# Patient Record
Sex: Female | Born: 1945 | Race: White | Hispanic: No | Marital: Married | Smoking: Never smoker
Health system: Southern US, Community
[De-identification: ages and names within clinical notes are randomized; demographics above are authoritative.]

## PROBLEM LIST (undated history)

## (undated) DIAGNOSIS — Z8249 Family history of ischemic heart disease and other diseases of the circulatory system: Secondary | ICD-10-CM

## (undated) DIAGNOSIS — Z780 Asymptomatic menopausal state: Secondary | ICD-10-CM

## (undated) DIAGNOSIS — E039 Hypothyroidism, unspecified: Secondary | ICD-10-CM

## (undated) DIAGNOSIS — Z7189 Other specified counseling: Secondary | ICD-10-CM

## (undated) HISTORY — DX: Asymptomatic menopausal state: Z78.0

## (undated) HISTORY — DX: Hypothyroidism, unspecified: E03.9

## (undated) HISTORY — DX: Family history of ischemic heart disease and other diseases of the circulatory system: Z82.49

## (undated) HISTORY — DX: Other specified counseling: Z71.89

---
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
## 2008-10-04 ENCOUNTER — Encounter: Admission: RE | Admit: 2008-10-04 | Discharge: 2008-10-04 | Payer: Self-pay | Admitting: Family Medicine

## 2009-06-13 ENCOUNTER — Ambulatory Visit: Payer: Self-pay | Admitting: Diagnostic Radiology

## 2009-06-13 ENCOUNTER — Emergency Department (HOSPITAL_BASED_OUTPATIENT_CLINIC_OR_DEPARTMENT_OTHER): Admission: EM | Admit: 2009-06-13 | Discharge: 2009-06-13 | Payer: Self-pay | Admitting: Emergency Medicine

## 2009-10-07 HISTORY — PX: REVISION OF SCAR TISSUE RECTUS MUSCLE: SHX2351

## 2011-01-11 LAB — URINE CULTURE
Colony Count: NO GROWTH
Culture: NO GROWTH

## 2011-01-11 LAB — URINALYSIS, ROUTINE W REFLEX MICROSCOPIC
Ketones, ur: 40 mg/dL — AB
Nitrite: NEGATIVE
Protein, ur: 100 mg/dL — AB
Specific Gravity, Urine: 1.025 (ref 1.005–1.030)
Urobilinogen, UA: 2 mg/dL — ABNORMAL HIGH (ref 0.0–1.0)

## 2011-01-21 DIAGNOSIS — Z8719 Personal history of other diseases of the digestive system: Secondary | ICD-10-CM | POA: Insufficient documentation

## 2014-05-04 DIAGNOSIS — H43399 Other vitreous opacities, unspecified eye: Secondary | ICD-10-CM | POA: Diagnosis not present

## 2014-07-22 DIAGNOSIS — N8320 Unspecified ovarian cysts: Secondary | ICD-10-CM | POA: Diagnosis not present

## 2014-07-22 DIAGNOSIS — Z01419 Encounter for gynecological examination (general) (routine) without abnormal findings: Secondary | ICD-10-CM | POA: Diagnosis not present

## 2014-07-22 DIAGNOSIS — N952 Postmenopausal atrophic vaginitis: Secondary | ICD-10-CM | POA: Diagnosis not present

## 2014-10-26 DIAGNOSIS — Z0000 Encounter for general adult medical examination without abnormal findings: Secondary | ICD-10-CM | POA: Diagnosis not present

## 2014-10-26 DIAGNOSIS — E039 Hypothyroidism, unspecified: Secondary | ICD-10-CM | POA: Diagnosis not present

## 2014-10-26 DIAGNOSIS — E785 Hyperlipidemia, unspecified: Secondary | ICD-10-CM | POA: Diagnosis not present

## 2014-10-26 LAB — LIPID PANEL
CHOLESTEROL: 227 mg/dL — AB (ref 0–200)
HDL: 57 mg/dL (ref 35–70)
LDL Cholesterol: 158 mg/dL
TRIGLYCERIDES: 101 mg/dL (ref 40–160)

## 2014-10-26 LAB — BASIC METABOLIC PANEL
BUN: 17 mg/dL (ref 4–21)
Creatinine: 0.8 mg/dL (ref <=1.1)

## 2014-10-26 LAB — TSH: TSH: 1.61 u[IU]/mL (ref <=5.90)

## 2014-10-26 LAB — HEPATIC FUNCTION PANEL
ALT: 18 U/L (ref 7–35)
AST: 26 U/L (ref 13–35)

## 2014-11-14 DIAGNOSIS — Z1231 Encounter for screening mammogram for malignant neoplasm of breast: Secondary | ICD-10-CM | POA: Diagnosis not present

## 2014-11-14 DIAGNOSIS — Z780 Asymptomatic menopausal state: Secondary | ICD-10-CM | POA: Diagnosis not present

## 2014-12-04 DIAGNOSIS — H1033 Unspecified acute conjunctivitis, bilateral: Secondary | ICD-10-CM | POA: Diagnosis not present

## 2015-07-10 DIAGNOSIS — N83202 Unspecified ovarian cyst, left side: Secondary | ICD-10-CM | POA: Diagnosis not present

## 2015-07-12 DIAGNOSIS — N83202 Unspecified ovarian cyst, left side: Secondary | ICD-10-CM | POA: Diagnosis not present

## 2015-07-12 DIAGNOSIS — Z780 Asymptomatic menopausal state: Secondary | ICD-10-CM | POA: Diagnosis not present

## 2015-07-12 DIAGNOSIS — E785 Hyperlipidemia, unspecified: Secondary | ICD-10-CM | POA: Diagnosis not present

## 2015-07-12 DIAGNOSIS — Z0000 Encounter for general adult medical examination without abnormal findings: Secondary | ICD-10-CM | POA: Diagnosis not present

## 2015-07-12 DIAGNOSIS — N83209 Unspecified ovarian cyst, unspecified side: Secondary | ICD-10-CM | POA: Diagnosis not present

## 2015-08-16 ENCOUNTER — Ambulatory Visit (INDEPENDENT_AMBULATORY_CARE_PROVIDER_SITE_OTHER): Payer: Medicare Other | Admitting: Osteopathic Medicine

## 2015-08-16 ENCOUNTER — Encounter: Payer: Self-pay | Admitting: Osteopathic Medicine

## 2015-08-16 ENCOUNTER — Ambulatory Visit (INDEPENDENT_AMBULATORY_CARE_PROVIDER_SITE_OTHER): Payer: Medicare Other

## 2015-08-16 VITALS — BP 123/78 | HR 64 | Ht 62.0 in | Wt 158.0 lb

## 2015-08-16 DIAGNOSIS — E039 Hypothyroidism, unspecified: Secondary | ICD-10-CM

## 2015-08-16 DIAGNOSIS — M79601 Pain in right arm: Secondary | ICD-10-CM | POA: Diagnosis not present

## 2015-08-16 DIAGNOSIS — M5136 Other intervertebral disc degeneration, lumbar region: Secondary | ICD-10-CM | POA: Diagnosis not present

## 2015-08-16 DIAGNOSIS — M545 Low back pain, unspecified: Secondary | ICD-10-CM

## 2015-08-16 DIAGNOSIS — G8929 Other chronic pain: Secondary | ICD-10-CM

## 2015-08-16 DIAGNOSIS — M79604 Pain in right leg: Secondary | ICD-10-CM

## 2015-08-16 DIAGNOSIS — M4806 Spinal stenosis, lumbar region: Secondary | ICD-10-CM | POA: Diagnosis not present

## 2015-08-16 DIAGNOSIS — Z9049 Acquired absence of other specified parts of digestive tract: Secondary | ICD-10-CM

## 2015-08-16 DIAGNOSIS — Z8249 Family history of ischemic heart disease and other diseases of the circulatory system: Secondary | ICD-10-CM

## 2015-08-16 DIAGNOSIS — Z780 Asymptomatic menopausal state: Secondary | ICD-10-CM

## 2015-08-16 DIAGNOSIS — M79606 Pain in leg, unspecified: Secondary | ICD-10-CM | POA: Insufficient documentation

## 2015-08-16 HISTORY — DX: Hypothyroidism, unspecified: E03.9

## 2015-08-16 HISTORY — DX: Family history of ischemic heart disease and other diseases of the circulatory system: Z82.49

## 2015-08-16 HISTORY — DX: Asymptomatic menopausal state: Z78.0

## 2015-08-16 LAB — TSH: TSH: 4.869 u[IU]/mL — ABNORMAL HIGH (ref 0.350–4.500)

## 2015-08-16 MED ORDER — DICLOFENAC SODIUM 1 % TD GEL
2.0000 g | Freq: Four times a day (QID) | TRANSDERMAL | Status: DC | PRN
Start: 2015-08-16 — End: 2015-08-16

## 2015-08-16 MED ORDER — MELOXICAM 7.5 MG PO TABS: 7.5000 mg | ORAL_TABLET | Freq: Every day | ORAL | Status: DC

## 2015-08-16 MED ORDER — DICLOFENAC SODIUM 1 % TD GEL: 2.0000 g | Freq: Four times a day (QID) | TRANSDERMAL | Status: DC | PRN

## 2015-08-16 NOTE — Progress Notes (Signed)
HPI: Hailey Parrish is a 69 y.o. female who presents to [facility]  today for chief complaint of:  Chief Complaint  Patient presents with  . Establish Care    back and leg pain   Back pain . Location: lower back, bilateral, in middle and across both side . Quality: soreness . Severity: moderate . Duration: 3 months, worse past few days . Context: no injury but (+)hx sciatica.  . Modifying factors: taking OTC pain meds which dull it but doesn't stop the pain, no XRay in the past, no old injury, hurts more if lying down  . Assoc signs/symptoms: No sciatica at this time, some aching in lower abdomen  R Leg . Location: R leg on lateral side around knee but not  . Quality: pulling pain, wakes her up . Severity: moderate . Duration: several months . Timing: intermittent . Context: no hx injury . Modifying factors: OTC meds helping some  Other:  goes to GYN for ovarian cyst last bloodwork showed borderline levels cholesterol Thyroid: last checked about 8 months ago   Past medical, social and family history reviewed: Past Medical History  Diagnosis Date  . Hypothyroidism 08/16/2015  . Family history of MI (myocardial infarction) 08/16/2015    father   No past surgical history on file. Social History  Substance Use Topics  . Smoking status: Not on file  . Smokeless tobacco: Not on file  . Alcohol Use: Not on file   Family History  Problem Relation Age of Onset  . Hyperlipidemia Mother   . Cancer Father     lung  . Heart attack Father     Current Outpatient Prescriptions  Medication Sig Dispense Refill  . levothyroxine (SYNTHROID, LEVOTHROID) 100 MCG tablet Take 100 mcg by mouth daily before breakfast.    . valACYclovir (VALTREX) 500 MG tablet Take 500 mg by mouth 2 (two) times daily.     No current facility-administered medications for this visit.   No Known Allergies    Review of Systems: CONSTITUTIONAL:  No  fever, no chills, No   unintentional weight changes HEAD/EYES/EARS/NOSE/THROAT: No headache, no vision change, no hearing change, No  sore throat CARDIAC: No chest pain, no pressure/palpitations, no orthopnea RESPIRATORY: No  cough, No  shortness of breath/wheeze GASTROINTESTINAL: No nausea, no vomiting, no abdominal pain, no blood in stool, no diarrhea, no constipation MUSCULOSKELETAL: Yes  Myalgia/arthralgia - see HPI GENITOURINARY: No incontinence, No abnormal genital bleeding/discharge SKIN: No rash/wounds/concerning lesions HEM/ONC: No easy bruising/bleeding, no abnormal lymph node ENDOCRINE: No polyuria/polydipsia/polyphagia, no heat/cold intolerance  NEUROLOGIC: No weakness, no dizziness, no slurred speech PSYCHIATRIC: No concerns with depression, no concerns with anxiety, no sleep problems    Exam:  BP 123/78 mmHg  Pulse 64  Ht 5\' 2"  (1.575 m)  Wt 158 lb (71.668 kg)  BMI 28.89 kg/m2 Constitutional: VSS, see above. General Appearance: alert, well-developed, well-nourished, NAD Respiratory: Normal respiratory effort. no wheeze, no rhonchi, no rales Cardiovascular: S1/S2 normal, no murmur, no rub/gallop auscultated. RRR. No lower extremity edema. Musculoskeletal: Gait normal. No clubbing/cyanosis of digits. Back: normal ROM flex/ext and rotation and sidebend, mild paraspinal tenderness lumbar spine bilateral, no midline tenderness. R leg/knee: ant drawer neg, meniscal signs neg, fibular head nontender possible posterior fibular head Neurological: No cranial nerve deficit on limited exam. Motor and sensation intact and symmetric Psychiatric: Normal judgment/insight. Normal mood and affect. Oriented x3.    No results found for this or any previous visit (from the past 72  hour(s)).    ASSESSMENT/PLAN:  Chronic low back pain - Plan: meloxicam (MOBIC) 7.5 MG tablet, Ambulatory referral to Physical Therapy, DG Lumbar Spine Complete, diclofenac sodium (VOLTAREN) 1 % GEL - XR expected to show arthritis, if  pain continues despite PT will consider MRI in the future but reassuring that no sciatica or weakness, also advised worthwhile to get orthotics, she has had these before but long time ago, wants to start walking more.   Leg pain, lateral, right - Plan: meloxicam (MOBIC) 7.5 MG tablet, Ambulatory referral to Physical Therapy, diclofenac sodium (VOLTAREN) 1 % GEL. OMT muscle energy to fibular head without relief of symptoms. Don't think XR would be helpful at this point but advised Voltaren gel and PT, consider imaging if still painful, no claudication  Hypothyroidism, unspecified hypothyroidism type - Plan: TSH   Return in about 6 months (around 02/13/2016), or if symptoms worsen or fail to improve, for ANNUAL PHYSICAL.

## 2015-08-16 NOTE — Patient Instructions (Signed)
DOWNSTAIRS FOR X-RAY AND LAB DRAW YOU SHOULD HEAR FROM US ABOUT LAB AND XRAY RESULTS IN 1 - 2 DAYS  YO USHOULD HEAR ABOUT PHYSICAL THERAPY APPOINTMENT,  OR YOU CAN WALK DOWN TO THEIR OFFICE IN THIS BUILDING AND SCHEDULE A VISIT.   MAKE AN APPOINTMENT WITH DR. Denyse AmassOREY OR DR. T. AT YOUR CONVENIENCE FOR ORTHOTICS.

## 2015-08-17 ENCOUNTER — Ambulatory Visit (INDEPENDENT_AMBULATORY_CARE_PROVIDER_SITE_OTHER): Payer: Medicare Other | Admitting: Family Medicine

## 2015-08-17 ENCOUNTER — Telehealth: Payer: Self-pay

## 2015-08-17 ENCOUNTER — Encounter: Payer: Self-pay | Admitting: Osteopathic Medicine

## 2015-08-17 ENCOUNTER — Other Ambulatory Visit: Payer: Self-pay

## 2015-08-17 VITALS — BP 130/85 | HR 57 | Temp 98.0°F | Resp 18 | Wt 159.0 lb

## 2015-08-17 DIAGNOSIS — M79671 Pain in right foot: Secondary | ICD-10-CM | POA: Insufficient documentation

## 2015-08-17 DIAGNOSIS — M545 Low back pain, unspecified: Secondary | ICD-10-CM

## 2015-08-17 DIAGNOSIS — M79604 Pain in right leg: Secondary | ICD-10-CM

## 2015-08-17 DIAGNOSIS — Z7189 Other specified counseling: Secondary | ICD-10-CM

## 2015-08-17 DIAGNOSIS — G8929 Other chronic pain: Secondary | ICD-10-CM

## 2015-08-17 HISTORY — DX: Other specified counseling: Z71.89

## 2015-08-17 MED ORDER — LEVOTHYROXINE SODIUM 112 MCG PO TABS: 112.0000 ug | ORAL_TABLET | Freq: Every day | ORAL | Status: DC

## 2015-08-17 MED ORDER — MELOXICAM 7.5 MG PO TABS: 7.5000 mg | ORAL_TABLET | Freq: Every day | ORAL | Status: DC

## 2015-08-17 NOTE — Progress Notes (Signed)
Hailey Parrish is a 69 y.o. female who [facility]  today for Foot pain. Patient was seen by her primary care provider yesterday. She notes continued chronic bilateral foot pain. Currently here right foot hurts the worst. She notes pain at the first metatarsal tarsal joints especially with walking. She's used orthotics in the past which helped some. She denies any recent injury fevers chills nausea vomiting or diarrhea.   Past Medical History  Diagnosis Date  . Hypothyroidism 08/16/2015  . Family history of MI (myocardial infarction) 08/16/2015    father  . Postmenopausal 08/16/2015  . Cardiac risk counseling 08/17/2015    ASCVD 10 year risk 6.7% calculated 08/2015   Past Surgical History  Procedure Laterality Date  . Appendectomy  1963  . Revision of scar tissue rectus muscle  2011   Social History  Substance Use Topics  . Smoking status: Not on file  . Smokeless tobacco: Not on file  . Alcohol Use: Not on file   family history includes Cancer in her father; Heart attack in her father; Hyperlipidemia in her mother.  ROS as above Medications: Current Outpatient Prescriptions  Medication Sig Dispense Refill  . diclofenac sodium (VOLTAREN) 1 % GEL Apply 2 g topically 4 (four) times daily as needed (muscle pain). 100 g 3  . levothyroxine (SYNTHROID, LEVOTHROID) 100 MCG tablet Take 100 mcg by mouth daily before breakfast.    . valACYclovir (VALTREX) 500 MG tablet Take 500 mg by mouth 2 (two) times daily.    . meloxicam (MOBIC) 7.5 MG tablet Take 1 tablet (7.5 mg total) by mouth daily. 30 tablet 3   No current facility-administered medications for this visit.   No Known Allergies   Exam:  BP 130/85 mmHg  Pulse 57  Temp(Src) 98 F (36.7 C) (Oral)  Resp 18  Wt 159 lb (72.122 kg)  SpO2 100% Gen: Well NAD MSK: Feet bilaterally have bunions and bunionette bilaterally with some collapse of both the transverse and longitudinal  arches.Her ankles do not have significant pronation or supination. Her right first ray metatarsal tarsal joint is tender to palpation. Pulses capillary refill and sensation are intact  Orthotics Note:   Patient was fitted for a : standard, cushioned, semi-rigid orthotic. The orthotic was heated and afterward the patient stood on the orthotic blank positioned on the orthotic stand. The patient was positioned in subtalar neutral position and 10 degrees of ankle dorsiflexion in a weight bearing stance. After completion of molding, a stable base was applied to the orthotic blank. The blank was ground to a stable position for weight bearing. Size: 9 Base: [facility] Additional Posting and Padding: None The patient ambulated these, and they were very comfortable.  I spent 40 minutes with this patient, greater than 50% was face-to-face time counseling regarding the below diagnosis.    Please see individual assessment and plan sections.

## 2015-08-17 NOTE — Addendum Note (Signed)
Addended by: Deirdre PippinsALEXANDER, Wm Fruchter M on: 08/17/2015 11:51 AM   Modules accepted: Orders

## 2015-08-17 NOTE — Telephone Encounter (Signed)
PA for diclofenac gel sent through covermymeds.  

## 2015-08-17 NOTE — Patient Instructions (Signed)
Thank you for coming in today. Lets try the orthotics.  Return if not improving.

## 2015-08-17 NOTE — Assessment & Plan Note (Signed)
Predominant right foot pain seems to be at the metatarsal tarsal joint. 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 Orthotics today. Return if not improving. At that time would obtain x-rays and consider ultrasound guided injection of the metatarsal tarsal joint.

## 2015-08-17 NOTE — Telephone Encounter (Signed)
Patient [facility]. Rhonda Cunningham,CMA

## 2015-08-21 ENCOUNTER — Encounter: Payer: Self-pay | Admitting: Physical Therapy

## 2015-08-21 ENCOUNTER — Telehealth: Payer: Self-pay

## 2015-08-21 ENCOUNTER — Ambulatory Visit (INDEPENDENT_AMBULATORY_CARE_PROVIDER_SITE_OTHER): Payer: Medicare Other | Admitting: Physical Therapy

## 2015-08-21 DIAGNOSIS — G8929 Other chronic pain: Secondary | ICD-10-CM

## 2015-08-21 DIAGNOSIS — R29898 Other symptoms and signs involving the musculoskeletal system: Secondary | ICD-10-CM

## 2015-08-21 DIAGNOSIS — E039 Hypothyroidism, unspecified: Secondary | ICD-10-CM

## 2015-08-21 DIAGNOSIS — M545 Low back pain, unspecified: Secondary | ICD-10-CM

## 2015-08-21 DIAGNOSIS — M79604 Pain in right leg: Secondary | ICD-10-CM

## 2015-08-21 DIAGNOSIS — M25561 Pain in right knee: Secondary | ICD-10-CM

## 2015-08-21 DIAGNOSIS — M6281 Muscle weakness (generalized): Secondary | ICD-10-CM | POA: Diagnosis present

## 2015-08-21 DIAGNOSIS — R198 Other specified symptoms and signs involving the digestive system and abdomen: Secondary | ICD-10-CM | POA: Diagnosis not present

## 2015-08-21 MED ORDER — DICLOFENAC SODIUM 1 % TD GEL: 2.0000 g | Freq: Four times a day (QID) | TRANSDERMAL | Status: DC | PRN

## 2015-08-21 MED ORDER — LEVOTHYROXINE SODIUM 112 MCG PO TABS: 112.0000 ug | ORAL_TABLET | Freq: Every day | ORAL | Status: DC

## 2015-08-21 NOTE — Telephone Encounter (Signed)
REFILL REQUEST SENT TO [facility]. Hailey Parrish,CMA

## 2015-08-21 NOTE — Therapy (Signed)
[facility] [place] [address] [place], [place], [address] Phone: [phone]   Fax:  [phone]  Physical Therapy Evaluation  Patient Details  Name: Hailey Parrish MRN: [id number] Date of Birth: [birth year] Referring Provider: Dr Sunnie Nielsen  Encounter Date: 08/21/2015      PT End of Session - 08/21/15 1020    Visit Number 1   Number of Visits 8   Date for PT Re-Evaluation 09/18/15   PT Start Time 0935   PT Stop Time 1036   PT Time Calculation (min) 61 min   Activity Tolerance Patient tolerated treatment well  Good response from TDN in her Rt lower leg      Past Medical History  Diagnosis Date  . Hypothyroidism 08/16/2015  . Family history of MI (myocardial infarction) 08/16/2015    father  . Postmenopausal 08/16/2015  . Cardiac risk counseling 08/17/2015    ASCVD 10 year risk 6.7% calculated 08/2015    Past Surgical History  Procedure Laterality Date  . Appendectomy  1963  . Revision of scar tissue rectus muscle  2011    There were no vitals filed for this visit.  Visit Diagnosis:  Weakness of back  Abdominal weakness  Arthralgia of right lower leg  Bilateral low back pain without sciatica      Subjective Assessment - 08/21/15 0933    Subjective Patient reports she developed LBP over the summer and was affecting her walking and moving. Then is went away and returned about 2 weeks ago. Leg pain is most likely not related ot her back pain.  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 Two weeks ago her Rt knee would catch and  a feeling like something was sliding in her knee.    Pertinent History Leg pain is more bothersome because it is constant. Had orthotics revised last week.    How long can you sit comfortably? no limitted   How long can you walk comfortably? 30 min   Diagnostic tests x-ray arthritis   Patient Stated Goals patient wishes to have leg pain relief or learn something she  can do to help it out. Currently able to perform all her activity however has pain with these.   Currently in Pain? Yes  no back pain at this time, it is intermittent   Pain Score 5   8/10 when she has sharp pain - like a spam   Pain Location --  Rt shin   Pain Orientation Right   Pain Descriptors / Indicators Aching;Sharp;Spasm   Pain Type Acute pain   Pain Frequency Constant   Aggravating Factors  a lot of walking, standing a lot   Pain Relieving Factors sitting and elevation sometimes.             [facility] PT Assessment - 08/21/15 0001    Assessment   Medical Diagnosis Low back pain and Rt lateral leg pain   Referring Provider Dr Sunnie Nielsen   Onset Date/Surgical Date 08/07/15   Hand Dominance Right   Next MD Visit 01/2016   Prior Therapy none   Precautions   Precautions None   Balance Screen   Has the patient fallen in the past 6 months No   Has the patient had a decrease in activity level because of a fear of falling?  No   Is the patient reluctant to leave their home because of a fear of falling?  No   Home Environment  Living Environment Private residence   Prior Function   Level of Independence Independent   Vocation Retired   Higher education careers adviserLeisure kayak, read, walking   Observation/Other Assessments   Focus on Therapeutic Outcomes (FOTO)  33% limited   Functional Tests   Functional tests Squat;Single leg stance   Squat   Comments bilat hip adduction   Single Leg Stance   Comments > 12 sec bilat   Posture/Postural Control   Posture/Postural Control Postural limitations   Postural Limitations --  Rt shoulder complex elevated   ROM / Strength   AROM / PROM / Strength AROM;Strength   AROM   Overall AROM Comments Lumbar, bilat LE's WNL   Strength   Overall Strength Comments LE's WNL   Strength Assessment Site Lumbar   Lumbar Flexion --  TA poor    Lumbar Extension --  multifdis fair   Flexibility   Soft Tissue Assessment /Muscle Length --  Good LE flexibility    Palpation   Palpation comment trigger points & tenderness in Rt ant tib, post ib and gastroc, very tight in Rt lumbar paraspinals & QL, slight on Lt side.    Special Tests    Special Tests Lumbar   Lumbar Tests Slump Test;Straight Leg Raise   Slump test   Findings Negative   Straight Leg Raise   Findings Negative                   [facility] Adult PT Treatment/Exercise - 08/21/15 0001    Exercises   Exercises Lumbar   Lumbar Exercises: Stretches   Single Knee to Chest Stretch 30 seconds  biilat   Lumbar Exercises: Supine   Ab Set 10 reps   Clam 5 reps   Heel Slides 5 reps   Bent Knee Raise 5 reps   Modalities   Modalities Electrical Stimulation;Moist Heat   Moist Heat Therapy   Number Minutes Moist Heat 15 Minutes   Moist Heat Location Lumbar Spine  Rt shin   Electrical Stimulation   Electrical Stimulation Location lumbar   Electrical Stimulation Action IFC   Electrical Stimulation Parameters to tolerance   Electrical Stimulation Goals Tone;Pain          Trigger Point Dry Needling - 08/21/15 1039    Consent Given? Yes   Education Handout Provided No   Muscles Treated Upper Body Tibialis anterior  tib posterior              PT Education - 08/21/15 1010    Education provided Yes   Education Details HEP and TDN   Person(s) Educated Patient   Methods Explanation;Demonstration;Handout   Comprehension Returned demonstration;Verbalized understanding             PT Long Term Goals - 08/21/15 1309    PT LONG TERM GOAL #1   Title I with HEP (09/18/15)    Time 4   Period Weeks   Status New   PT LONG TERM GOAL #2   Title report pain decrease in Rt lower leg =/> 75% with daily activity (09/18/15)    Time 4   Period Weeks   Status New   PT LONG TERM GOAL #3   Title report back pain decrease =/> 75% with walking (09/18/15)    Time 4   Period Weeks   Status New   PT LONG TERM GOAL #4   Title perform core exercise with good contraction of TA  and multifidis (09/18/15)    Time 4   Period  Weeks   Status New   PT LONG TERM GOAL #5   Title improve FOTO =/< 29% limited CJ level (09/18/15)    Time 4   Period Weeks   Status New               Plan - 08/21/15 1306    Clinical Impression Statement 69 yo female presents with intermittent LBP and constant Rt lower leg pain.  She has a h/o Rt foot issues for many years, never had PT for this. She has some deep core weakness, trigger points in the Rt lower leg musculature  along with tightness in her back.    Pt will benefit from skilled therapeutic intervention in order to improve on the following deficits Increased muscle spasms;Pain;Difficulty walking;Decreased strength   Rehab Potential Excellent   PT Frequency 2x / week   PT Duration 4 weeks   PT Treatment/Interventions Vasopneumatic Device;Manual techniques;Therapeutic exercise;Moist Heat;Iontophoresis /ml Dexamethasone;Electrical Stimulation;Cryotherapy;Dry needling;Passive range of motion;Patient/family education;Gait training;Balance training;Ultrasound   PT Next Visit Plan progress core exercise, add in some ankle ther ex   Consulted and Agree with Plan of Care Patient         Problem List Patient Active Problem List   Diagnosis Date Noted  . Cardiac risk counseling 08/17/2015  . Foot pain, right 08/17/2015  . Hypothyroidism 08/16/2015  . History of appendectomy 08/16/2015  . Family history of MI (myocardial infarction) 08/16/2015  . Postmenopausal 08/16/2015  . Chronic low back pain 08/16/2015  . Leg pain, lateral 08/16/2015    Roderic Scarce PT 08/21/2015, 1:15 PM  [facility] [address] [place], [place], [address] Phone: [phone]   Fax:  [phone]  Name: Hailey Parrish MRN: [id number] Date of Birth: 1945-12-24

## 2015-08-21 NOTE — Patient Instructions (Signed)
Abdominal Bracing With Pelvic Floor (Hook-Lying)   With neutral spine, tighten pelvic floor and abdominals. Hold 5 seconds. Repeat __10_ times. Do _1__ times a day.   Knee to Chest: Transverse Plane Stability   Bring one knee up, then return. Be sure pelvis does not roll side to side. Keep pelvis still. Lift knee __10_ times each leg. Restabilize pelvis. Repeat with other leg. Do _1-2__ sets, _1__ times per day.  Hip External Rotation With Pillow: Transverse Plane Stability   One knee bent, one leg straight, on pillow. Slowly roll bent knee out. Be sure pelvis does not rotate. Do _10__ times. Restabilize pelvis. Repeat with other leg. Do _1-2__ sets, _1__ times per day.  Heel Slide: 4-10 Inches - Transverse Plane Stability   Slide heel 4 inches down. Be sure pelvis does not rotate. Do _10__ times. Restabilize pelvis. Repeat with other leg. Do __1_ sets, _1__ times per day.  Supine Knee to Chest    Lie on back. Gently pull one knee toward chest. Hold _30__ seconds.  Repeat _1__ times per session. Do _1__ sessions per day.  Lower Trunk Rotation Stretch    Keeping back flat and feet together, rotate knees to left side. Hold _1-2___ seconds. Then rotate knees to the other side. Repeat __10__ times per set. Do ___1_ sets per session. Do __1__ sessions per day.    [facility] [address] [place], [place] [address]  [phone] (office) [phone] (fax)  Trigger Point Dry Needling  . What is Trigger Point Dry Needling (DN)? o DN is a physical therapy technique used to treat muscle pain and dysfunction. Specifically, DN helps deactivate muscle trigger points (muscle knots).  o A thin filiform needle is used to penetrate the skin and stimulate the underlying trigger point. The goal is for a local twitch response (LTR) to occur and for the trigger point to relax. No medication of any kind is injected during the  procedure.   . What Does Trigger Point Dry Needling Feel Like?  o The procedure feels different for each individual patient. Some patients report that they do not actually feel the needle enter the skin and overall the process is not painful. Very mild bleeding may occur. However, many patients feel a deep cramping in the muscle in which the needle was inserted. This is the local twitch response.   Marland Kitchen. How Will I feel after the treatment? o Soreness is normal, and the onset of soreness may not occur for a few hours. Typically this soreness does not last longer than two days.  o Bruising is uncommon, however; ice can be used to decrease any possible bruising.  o In rare cases feeling tired or nauseous after the treatment is normal. In addition, your symptoms may get worse before they get better, this period will typically not last longer than 24 hours.   . What Can I do After My Treatment? o Increase your hydration by drinking more water for the next 24 hours. o You may place ice or heat on the areas treated that have become sore, however, do not use heat on inflamed or bruised areas. Heat often brings more relief post needling. o You can continue your regular activities, but vigorous activity is not recommended initially after the treatment for 24 hours. o DN is best combined with other physical therapy such as strengthening, stretching, and other therapies.  o

## 2015-08-22 ENCOUNTER — Ambulatory Visit: Payer: Medicare Other

## 2015-08-28 ENCOUNTER — Ambulatory Visit (INDEPENDENT_AMBULATORY_CARE_PROVIDER_SITE_OTHER): Payer: Medicare Other | Admitting: Physical Therapy

## 2015-08-28 DIAGNOSIS — M545 Low back pain, unspecified: Secondary | ICD-10-CM

## 2015-08-28 DIAGNOSIS — M6281 Muscle weakness (generalized): Secondary | ICD-10-CM | POA: Diagnosis not present

## 2015-08-28 DIAGNOSIS — R198 Other specified symptoms and signs involving the digestive system and abdomen: Secondary | ICD-10-CM | POA: Diagnosis present

## 2015-08-28 DIAGNOSIS — M25561 Pain in right knee: Secondary | ICD-10-CM

## 2015-08-28 DIAGNOSIS — R29898 Other symptoms and signs involving the musculoskeletal system: Secondary | ICD-10-CM

## 2015-08-28 NOTE — Therapy (Signed)
[facility] [address] [place], [place], [address] Phone: [phone]   Fax:  [phone]  Physical Therapy Treatment  Patient Details  Name: Hailey Fitchancy Gillentine MRN: [id number] Date of Birth: [birth year] Referring Provider: Dr. Lyn HollingsheadAlexander  Encounter Date: 08/28/2015      PT End of Session - 08/28/15 1021    Visit Number 2   Number of Visits 8   Date for PT Re-Evaluation 09/18/15   PT Start Time 1019   PT Stop Time 1115   PT Time Calculation (min) 56 min      Past Medical History  Diagnosis Date  . Hypothyroidism 08/16/2015  . Family history of MI (myocardial infarction) 08/16/2015    father  . Postmenopausal 08/16/2015  . Cardiac risk counseling 08/17/2015    ASCVD 10 year risk 6.7% calculated 08/2015    Past Surgical History  Procedure Laterality Date  . Appendectomy  1963  . Revision of scar tissue rectus muscle  2011    There were no vitals filed for this visit.  Visit Diagnosis:  Abdominal weakness  Arthralgia of right lower leg  Weakness of back  Bilateral low back pain without sciatica      Subjective Assessment - 08/28/15 1021    Subjective Pt reports that the Rt leg pain comes and goes.  [place]  Shopping (several hours) with daughter was difficult.     Currently in Pain? Yes   Pain Score 2    Pain Location Leg  lateral shin   Pain Orientation Right   Pain Descriptors / Indicators --  "twinge"    Aggravating Factors  a lot of walking, standing, pushing gas pedal   Pain Relieving Factors sitting, position, heat            [facility] PT Assessment - 08/28/15 0001    Assessment   Medical Diagnosis Low back pain and Rt lateral leg pain   Referring Provider Dr. Lyn HollingsheadAlexander   Onset Date/Surgical Date 08/07/15   Hand Dominance Right   Next MD Visit PRN   Prior Therapy none         [facility] Adult PT Treatment/Exercise - 08/28/15 0001    Exercises   Exercises Knee/Hip;Ankle;Lumbar   Lumbar Exercises: Stretches   Passive Hamstring Stretch 2 reps;30 seconds   Single Knee to Chest Stretch 30 seconds  biilat   Lower Trunk Rotation 3 reps;10 seconds   Lumbar Exercises: Aerobic   Stationary Bike L1: 6 min   Lumbar Exercises: Supine   Ab Set 5 reps;5 seconds   Clam 5 reps  with ab set, each leg   Heel Slides 5 reps  with ab set, each leg   Bent Knee Raise 10 reps  with ab set, each leg   Knee/Hip Exercises: Stretches   Gastroc Stretch Right;Left;2 reps;30 seconds   Soleus Stretch Right;Left;2 reps;30 seconds   Modalities   Modalities Electrical Stimulation;Moist Heat   Moist Heat Therapy   Number Minutes Moist Heat 15 Minutes   Moist Heat Location Lumbar Spine  Rt shin   Electrical Stimulation   Electrical Stimulation Location Rt ant/post tibialis    Electrical Stimulation Action IFC   Electrical Stimulation Parameters to tolerance   Electrical Stimulation Goals Pain;Tone   Manual Therapy   Manual Therapy Soft tissue mobilization;Myofascial release   Soft tissue mobilization to anterior and posterior tib of RLE.  Edge tool assistance to anterior tib/ fibularis longus - to decrease pain and  fascial restrctions.    Myofascial Release to Rt fibularis muscle group.    Ankle Exercises: Supine   T-Band Eversion, inversion, DF with Rt ankle, red band x 10 reps each. Required some tactile cues for form.                      PT Long Term Goals - 08/21/15 1309    PT LONG TERM GOAL #1   Title I with HEP (09/18/15)    Time 4   Period Weeks   Status New   PT LONG TERM GOAL #2   Title report pain decrease in Rt lower leg =/> 75% with daily activity (09/18/15)    Time 4   Period Weeks   Status New   PT LONG TERM GOAL #3   Title report back pain decrease =/> 75% with walking (09/18/15)    Time 4   Period Weeks   Status New   PT LONG TERM GOAL #4   Title perform core exercise with good contraction of TA and  multifidis (09/18/15)    Time 4   Period Weeks   Status New   PT LONG TERM GOAL #5   Title improve FOTO =/< 29% limited CJ level (09/18/15)    Time 4   Period Weeks   Status New               Plan - 08/28/15 1336    Clinical Impression Statement Pt tolerated all exercises well, requiring some tactile/ VC for form.  Pt tolerated all exercises except LTR - which caused increased twinge pain in Rt shin when knees to Lt.  Pt reported decrease in symptoms with use of estim/heat at end of session.  Progressing towards goals.    Pt will benefit from skilled therapeutic intervention in order to improve on the following deficits Increased muscle spasms;Pain;Difficulty walking;Decreased strength   Rehab Potential Excellent   PT Frequency 2x / week   PT Duration 4 weeks   PT Treatment/Interventions Vasopneumatic Device;Manual techniques;Therapeutic exercise;Moist Heat;Iontophoresis /ml Dexamethasone;Electrical Stimulation;Cryotherapy;Dry needling;Passive range of motion;Patient/family education;Gait training;Balance training;Ultrasound   PT Next Visit Plan Progress core exercises, repeat ankle exercises then add to HEP.    Consulted and Agree with Plan of Care Patient        Problem List Patient Active Problem List   Diagnosis Date Noted  . Cardiac risk counseling 08/17/2015  . Foot pain, right 08/17/2015  . Hypothyroidism 08/16/2015  . History of appendectomy 08/16/2015  . Family history of MI (myocardial infarction) 08/16/2015  . Postmenopausal 08/16/2015  . Chronic low back pain 08/16/2015  . Leg pain, lateral 08/16/2015    Mayer Camel, PTA 08/28/2015 1:43 PM  [facility] [address] [place], [place], [address] Phone: [phone]   Fax:  [phone]  Name: Hailey Parrish MRN: [id number] Date of Birth: 05-29-1946

## 2015-08-29 ENCOUNTER — Ambulatory Visit (INDEPENDENT_AMBULATORY_CARE_PROVIDER_SITE_OTHER): Payer: Medicare Other | Admitting: Physical Therapy

## 2015-08-29 ENCOUNTER — Encounter: Payer: Self-pay | Admitting: Physical Therapy

## 2015-08-29 DIAGNOSIS — M25561 Pain in right knee: Secondary | ICD-10-CM | POA: Diagnosis present

## 2015-08-29 DIAGNOSIS — M6281 Muscle weakness (generalized): Secondary | ICD-10-CM | POA: Diagnosis not present

## 2015-08-29 DIAGNOSIS — R29898 Other symptoms and signs involving the musculoskeletal system: Secondary | ICD-10-CM

## 2015-08-29 DIAGNOSIS — R198 Other specified symptoms and signs involving the digestive system and abdomen: Secondary | ICD-10-CM

## 2015-08-29 NOTE — Therapy (Signed)
[facility] [address] [place], [place], [address] Phone: [phone]   Fax:  [phone]  Physical Therapy Treatment  Patient Details  Name: Hailey Parrish MRN: [id number] Date of Birth: 06/05/1946 Referring Provider: Dr. Lyn Hollingshead  Encounter Date: 08/29/2015      PT End of Session - 08/29/15 0937    Visit Number 3   Number of Visits 8   Date for PT Re-Evaluation 09/18/15   PT Start Time 6962   Activity Tolerance Patient tolerated treatment well      Past Medical History  Diagnosis Date  . Hypothyroidism 08/16/2015  . Family history of MI (myocardial infarction) 08/16/2015    father  . Postmenopausal 08/16/2015  . Cardiac risk counseling 08/17/2015    ASCVD 10 year risk 6.7% calculated 08/2015    Past Surgical History  Procedure Laterality Date  . Appendectomy  1963  . Revision of scar tissue rectus muscle  2011    There were no vitals filed for this visit.  Visit Diagnosis:  Arthralgia of right lower leg  Abdominal weakness  Weakness of back      Subjective Assessment - 08/29/15 0940    Subjective Pt thinks the pain is down some since the initial visit. Pt feels that her leg pain was down after the needling.    Currently in Pain? Yes   Pain Score 2    Pain Location Leg   Pain Orientation Right   Pain Radiating Towards lateral shin                         [facility] Adult PT Treatment/Exercise - 08/29/15 0001    Exercises   Exercises Knee/Hip   Lumbar Exercises: Stretches   Active Hamstring Stretch --  runners stretch   Single Knee to Chest Stretch 30 seconds  each side   Lumbar Exercises: Aerobic   Stationary Bike L2: 6 min   Lumbar Exercises: Standing   Heel Raises 20 reps  off edge of step   Wall Slides 20 reps  VC for form   Other Standing Lumbar Exercises 2x10 hip ext with TA contractions   Lumbar Exercises: Supine   Other Supine Lumbar Exercises 10 reps x 5sec table  top holds then toe taps   Lumbar Exercises: Prone   Other Prone Lumbar Exercises pelvic press, then with knee bends and leg lifts VC's for form   Modalities   Modalities Electrical Stimulation;Moist Heat   Moist Heat Therapy   Number Minutes Moist Heat 15 Minutes   Moist Heat Location --  Rt shin, back feels good   Electrical Stimulation   Electrical Stimulation Location Rt ant/post tibialis    Electrical Stimulation Action IFC   Electrical Stimulation Parameters to tolerance   Electrical Stimulation Goals Pain   Manual Therapy   Manual Therapy Soft tissue mobilization;Myofascial release   Soft tissue mobilization Rt ant shin          Trigger Point Dry Needling - 08/29/15 1006    Consent Given? Yes   Education Handout Provided No   Muscles Treated Upper Body Tibialis anterior  tib posterior, no twitch                   PT Long Term Goals - 08/21/15 1309    PT LONG TERM GOAL #1   Title I with HEP (09/18/15)    Time 4   Period Weeks   Status New   PT  LONG TERM GOAL #2   Title report pain decrease in Rt lower leg =/> 75% with daily activity (09/18/15)    Time 4   Period Weeks   Status New   PT LONG TERM GOAL #3   Title report back pain decrease =/> 75% with walking (09/18/15)    Time 4   Period Weeks   Status New   PT LONG TERM GOAL #4   Title perform core exercise with good contraction of TA and multifidis (09/18/15)    Time 4   Period Weeks   Status New   PT LONG TERM GOAL #5   Title improve FOTO =/< 29% limited CJ level (09/18/15)    Time 4   Period Weeks   Status New               Plan - 08/29/15 0957    Clinical Impression Statement This is only patients first full week of PT she is doing better with her HEP and performing them correctly now. She is challenged with performing pelvic press routine. Pt with less trigger points in Rt lower leg,    Pt will benefit from skilled therapeutic intervention in order to improve on the following  deficits Increased muscle spasms;Pain;Difficulty walking;Decreased strength   Rehab Potential Excellent   PT Frequency 2x / week   PT Duration 4 weeks   PT Treatment/Interventions Vasopneumatic Device;Manual techniques;Therapeutic exercise;Moist Heat;Iontophoresis 4mg /ml Dexamethasone;Electrical Stimulation;Cryotherapy;Dry needling;Passive range of motion;Patient/family education;Gait training;Balance training;Ultrasound   PT Next Visit Plan add pelvic press routine to HEP   Consulted and Agree with Plan of Care Patient        Problem List Patient Active Problem List   Diagnosis Date Noted  . Cardiac risk counseling 08/17/2015  . Foot pain, right 08/17/2015  . Hypothyroidism 08/16/2015  . History of appendectomy 08/16/2015  . Family history of MI (myocardial infarction) 08/16/2015  . Postmenopausal 08/16/2015  . Chronic low back pain 08/16/2015  . Leg pain, lateral 08/16/2015    Roderic ScarceSusan Shaver PT 08/29/2015, 10:34 AM  [facility] [address] [place], [place], [address] Phone: [phone]   Fax:  [phone]  Name: Hailey Parrish MRN: [id number] Date of Birth: 25-Mar-1946

## 2015-08-30 ENCOUNTER — Other Ambulatory Visit: Payer: Self-pay | Admitting: Osteopathic Medicine

## 2015-09-04 ENCOUNTER — Encounter: Payer: Self-pay | Admitting: Rehabilitative and Restorative Service Providers"

## 2015-09-04 ENCOUNTER — Ambulatory Visit (INDEPENDENT_AMBULATORY_CARE_PROVIDER_SITE_OTHER): Payer: Medicare Other | Admitting: Physical Therapy

## 2015-09-04 DIAGNOSIS — M25561 Pain in right knee: Secondary | ICD-10-CM

## 2015-09-04 DIAGNOSIS — R198 Other specified symptoms and signs involving the digestive system and abdomen: Secondary | ICD-10-CM | POA: Diagnosis not present

## 2015-09-04 DIAGNOSIS — M6281 Muscle weakness (generalized): Secondary | ICD-10-CM | POA: Diagnosis not present

## 2015-09-04 DIAGNOSIS — M545 Low back pain, unspecified: Secondary | ICD-10-CM

## 2015-09-04 DIAGNOSIS — R29898 Other symptoms and signs involving the musculoskeletal system: Secondary | ICD-10-CM

## 2015-09-04 NOTE — Patient Instructions (Signed)
Pelvic Press  Perform series once a day,      [place] [phone]    Place hands under belly between navel and pubic bone, palms up. Feel pressure on hands. Increase pressure on hands by pressing pelvis down. This is NOT a pelvic tilt. Hold _5__ seconds. Relax. Repeat _10__ times.   Self-Mobilization: Knee Flexion (Prone)    Perform pelvic press. Bring left heel toward buttocks as close as possible. Hold __1__ seconds. Relax. Repeat _10___ times per leg, then perform with both legs.  Do __1__ session per day.  Leg Lift: One-Leg    Press pelvis down. Keep knee straight; lengthen and lift one leg (from waist). Do not twist body. Keep other leg down. Hold _1__ seconds. Relax. Repeat 10 times. Repeat with other leg.  Shoulder Blade Squeeze: Fingers Interlaced    Fingers interlaced behind your body, palms facing each other. Press pelvis down. Squeeze backbone with shoulder blades. Raise front of shoulders, chest, and head. Keep neck neutral. Hold _1__ seconds. Relax. Repeat _10__ times.  Shoulder Blade Squeeze: W    Arms out to sides at 90 palms down. Bend elbows to 90. Press pelvis down. Squeeze backbone with shoulder blades. Raise arms, front of shoulders, chest, and head. Keep neck neutral. Hold _1__ seconds. Relax. Repeat _10__ times.  Shoulder Blade Squeeze: Airplane    Arms out to sides at 90, elbows straight, palms down. Press pelvis down. Squeeze backbone with shoulder blades. Raise arms, front of shoulders, chest, and head. Keep neck neutral. Hold _1__ seconds. Relax. Repeat _10__ times.  Shoulder Blade Squeeze: Superperson    Arms alongside head, elbows straight, palms down. Press pelvis down. Squeeze backbone with shoulder blades. Raise arms, chest, and head. Keep neck neutral. Hold __1_ seconds. Relax. Repeat _10__ times.  Copyright  VHI. All rights reserved.

## 2015-09-04 NOTE — Therapy (Signed)
[place] [address] [place], [place], [address] Phone: [phone]   Fax:  [phone]  Physical Therapy Treatment  Patient Details  Name: Hailey Parrish MRN: [id number] Date of Birth: [birth year] Referring Provider: Dr Sheppard Coil  Encounter Date: 09/04/2015      PT End of Session - 09/04/15 0929    Visit Number 4   Number of Visits 8   Date for PT Re-Evaluation 09/18/15   PT Start Time 0928   PT Stop Time 1013   PT Time Calculation (min) 45 min   Activity Tolerance Patient tolerated treatment well      Past Medical History  Diagnosis Date  . Hypothyroidism 08/16/2015  . Family history of MI (myocardial infarction) 08/16/2015    father  . Postmenopausal 08/16/2015  . Cardiac risk counseling 08/17/2015    ASCVD 10 year risk 6.7% calculated 08/2015    Past Surgical History  Procedure Laterality Date  . Appendectomy  1963  . Revision of scar tissue rectus muscle  2011    There were no vitals filed for this visit.  Visit Diagnosis:  Arthralgia of right lower leg  Abdominal weakness  Weakness of back  Bilateral low back pain without sciatica      Subjective Assessment - 09/04/15 0929    Subjective Pt reports no pain today, only had a little aching over the weekend   Currently in Pain? No            [facility] PT Assessment - 09/04/15 0001    Assessment   Medical Diagnosis Low back pain and Rt lateral leg pain   Referring Provider Dr Sheppard Coil   Onset Date/Surgical Date 08/07/15   Hand Dominance Right   Next MD Visit PRN   Prior Therapy none                     [facility] Adult PT Treatment/Exercise - 09/04/15 0001    Exercises   Exercises Knee/Hip   Lumbar Exercises: Stretches   Passive Hamstring Stretch 30 seconds  each with strap   Single Knee to Chest Stretch 30 seconds   Double Knee to Chest Stretch 30 seconds   ITB Stretch 3 reps;30 seconds  cross body with strap   Lumbar  Exercises: Aerobic   Stationary Bike L3x6'   Lumbar Exercises: Seated   Sit to Stand 20 reps  with hip flex in standing, TA contaction   Lumbar Exercises: Supine   Heel Slides 10 reps  out of table top position   Bridge 10 reps  then with knee extension   Isometric Hip Flexion 10 reps;5 seconds  each side, VC for form   Other Supine Lumbar Exercises 10reps LTR using obliques   Lumbar Exercises: Prone   Other Prone Lumbar Exercises 10 reps pelvic press series upper & lower   Ankle Exercises: Supine   T-Band everson, DF with green band x 30                PT Education - 09/04/15 0938    Education Details HEP, pelvic press serires   Person(s) Educated Patient   Methods Demonstration;Handout   Comprehension Returned demonstration             PT Long Term Goals - 09/04/15 0946    PT LONG TERM GOAL #1   Title I with HEP (09/18/15)    Status On-going   PT LONG TERM GOAL #2   Title report pain decrease in Rt  lower leg =/> 75% with daily activity (09/18/15)    Status Achieved  Pt states 75% improvement overall   PT LONG TERM GOAL #3   Title report back pain decrease =/> 75% with walking (09/18/15)    Status On-going   PT LONG TERM GOAL #4   Title perform core exercise with good contraction of TA and multifidis (09/18/15)    Status On-going   PT LONG TERM GOAL #5   Title improve FOTO =/< 29% limited CJ level (09/18/15)    Status On-going               Plan - 09/04/15 1009    Clinical Impression Statement Hailey Parrish is doing well, she has met one goal and is progressing to the others.  Having good pain reduction.  Will try without modalities today. Core is getting stronger/.   Pt will benefit from skilled therapeutic intervention in order to improve on the following deficits Increased muscle spasms;Pain;Difficulty walking;Decreased strength   Rehab Potential Excellent   PT Frequency 2x / week   PT Duration 4 weeks   PT Treatment/Interventions Vasopneumatic  Device;Manual techniques;Therapeutic exercise;Moist Heat;Iontophoresis 28m/ml Dexamethasone;Electrical Stimulation;Cryotherapy;Dry needling;Passive range of motion;Patient/family education;Gait training;Balance training;Ultrasound   PT Next Visit Plan see how new HEP is going and how she did without modalities.         Problem List Patient Active Problem List   Diagnosis Date Noted  . Cardiac risk counseling 08/17/2015  . Foot pain, right 08/17/2015  . Hypothyroidism 08/16/2015  . History of appendectomy 08/16/2015  . Family history of MI (myocardial infarction) 08/16/2015  . Postmenopausal 08/16/2015  . Chronic low back pain 08/16/2015  . Leg pain, lateral 08/16/2015    SJeral PinchPT 09/04/2015, 10:15 AM  [facility] [address]Phone: [phone]  Fax:  [phone] Name: Hailey UplingerMRN: [id number] of Birth: 9Dec 30, 1947

## 2015-09-06 ENCOUNTER — Ambulatory Visit (INDEPENDENT_AMBULATORY_CARE_PROVIDER_SITE_OTHER): Payer: Medicare Other | Admitting: Physical Therapy

## 2015-09-06 ENCOUNTER — Encounter: Payer: Self-pay | Admitting: Physical Therapy

## 2015-09-06 DIAGNOSIS — M25561 Pain in right knee: Secondary | ICD-10-CM | POA: Diagnosis present

## 2015-09-06 DIAGNOSIS — M545 Low back pain, unspecified: Secondary | ICD-10-CM

## 2015-09-06 DIAGNOSIS — R29898 Other symptoms and signs involving the musculoskeletal system: Secondary | ICD-10-CM

## 2015-09-06 DIAGNOSIS — R198 Other specified symptoms and signs involving the digestive system and abdomen: Secondary | ICD-10-CM

## 2015-09-06 DIAGNOSIS — M6281 Muscle weakness (generalized): Secondary | ICD-10-CM | POA: Diagnosis not present

## 2015-09-06 NOTE — Therapy (Signed)
[facility] [place] [address] [place], [place], [address] Phone: [phone]   Fax:  [phone]  Physical Therapy Treatment  Patient Details  Name: Hailey Parrish MRN: [id number] Date of Birth: 01/13/1946 Referring Provider: Dr Lyn Hollingshead  Encounter Date: 09/06/2015      PT End of Session - 09/06/15 0733    Visit Number 5   Number of Visits 8   Date for PT Re-Evaluation 09/18/15   PT Start Time 0733   PT Stop Time 0811   PT Time Calculation (min) 38 min   Activity Tolerance Patient tolerated treatment well      Past Medical History  Diagnosis Date  . Hypothyroidism 08/16/2015  . Family history of MI (myocardial infarction) 08/16/2015    father  . Postmenopausal 08/16/2015  . Cardiac risk counseling 08/17/2015    ASCVD 10 year risk 6.7% calculated 08/2015    Past Surgical History  Procedure Laterality Date  . Appendectomy  1963  . Revision of scar tissue rectus muscle  2011    There were no vitals filed for this visit.  Visit Diagnosis:  Arthralgia of right lower leg  Abdominal weakness  Weakness of back  Bilateral low back pain without sciatica      Subjective Assessment - 09/06/15 0735    Subjective Pt reports she one place on the outside of the Rt lower leg that will grab at times.  This is not happening as often as it used to.    Currently in Pain? No/denies                         [facility] Adult PT Treatment/Exercise - 09/06/15 0001    Lumbar Exercises: Stretches   ITB Stretch 30 seconds;3 reps  with strap   Lumbar Exercises: Aerobic   Stationary Bike L3x6'   Lumbar Exercises: Standing   Heel Raises --  10 reps each toe in/out/straight   Wall Slides --  4 reps holding with 5 reps FWD reach with 5#   Lumbar Exercises: Supine   Bridge 10 reps  single leg    Bridge Limitations 10 reps, 5 sec hold with feet on ball.    Other Supine Lumbar Exercises 15reps, leg press with green band  and TA contraction   Other Supine Lumbar Exercises 2x5 breaths of pilates 100's                     PT Long Term Goals - 09/04/15 0946    PT LONG TERM GOAL #1   Title I with HEP (09/18/15)    Status On-going   PT LONG TERM GOAL #2   Title report pain decrease in Rt lower leg =/> 75% with daily activity (09/18/15)    Status Achieved  Pt states 75% improvement overall   PT LONG TERM GOAL #3   Title report back pain decrease =/> 75% with walking (09/18/15)    Status On-going   PT LONG TERM GOAL #4   Title perform core exercise with good contraction of TA and multifidis (09/18/15)    Status On-going   PT LONG TERM GOAL #5   Title improve FOTO =/< 29% limited CJ level (09/18/15)    Status On-going               Plan - 09/06/15 0804    Clinical Impression Statement Stellar's pain is slowly improving. She did well without modalites last session.  She is driving  for a couple hrs today and this usually  has increased pain with this, we will see how the ride is today.  She contiinues to tolerate more core exercise without flare ups   Pt will benefit from skilled therapeutic intervention in order to improve on the following deficits Increased muscle spasms;Pain;Difficulty walking;Decreased strength   Rehab Potential Excellent   PT Frequency 2x / week   PT Duration 4 weeks   PT Treatment/Interventions Vasopneumatic Device;Manual techniques;Therapeutic exercise;Moist Heat;Iontophoresis 4mg /ml Dexamethasone;Electrical Stimulation;Cryotherapy;Dry needling;Passive range of motion;Patient/family education;Gait training;Balance training;Ultrasound   PT Next Visit Plan see how the ride was as relates to pain   Consulted and Agree with Plan of Care Patient        Problem List Patient Active Problem List   Diagnosis Date Noted  . Cardiac risk counseling 08/17/2015  . Foot pain, right 08/17/2015  . Hypothyroidism 08/16/2015  . History of appendectomy 08/16/2015  . Family  history of MI (myocardial infarction) 08/16/2015  . Postmenopausal 08/16/2015  . Chronic low back pain 08/16/2015  . Leg pain, lateral 08/16/2015    Roderic ScarceSusan Shaver PT 09/06/2015, 8:12 AM  [facility] [address] [place], [place], [address] Phone: [phone]   Fax:  [phone]  Name: Chapman Fitchancy Uhlir MRN: [id number] Date of Birth: 1945/12/20

## 2015-09-11 ENCOUNTER — Ambulatory Visit (INDEPENDENT_AMBULATORY_CARE_PROVIDER_SITE_OTHER): Payer: Medicare Other | Admitting: Physical Therapy

## 2015-09-11 DIAGNOSIS — R198 Other specified symptoms and signs involving the digestive system and abdomen: Secondary | ICD-10-CM | POA: Diagnosis not present

## 2015-09-11 DIAGNOSIS — M25561 Pain in right knee: Secondary | ICD-10-CM | POA: Diagnosis present

## 2015-09-11 DIAGNOSIS — M545 Low back pain, unspecified: Secondary | ICD-10-CM

## 2015-09-11 DIAGNOSIS — M6281 Muscle weakness (generalized): Secondary | ICD-10-CM | POA: Diagnosis not present

## 2015-09-11 DIAGNOSIS — R29898 Other symptoms and signs involving the musculoskeletal system: Secondary | ICD-10-CM

## 2015-09-11 NOTE — Therapy (Signed)
[facility] [place] [address] [address] [place], [place], [address] Phone: [phone]   Fax:  [phone]  Physical Therapy Treatment  Patient Details  Name: Hailey Parrish MRN: [id number] Date of Birth: 05-13-1946 Referring Provider: Dr. Lyn Hollingshead   Encounter Date: 09/11/2015      PT End of Session - 09/11/15 0936    Visit Number 6   Number of Visits 8   Date for PT Re-Evaluation 09/18/15   PT Start Time 0934   PT Stop Time 1016   PT Time Calculation (min) 42 min   Activity Tolerance Patient tolerated treatment well;No increased pain      Past Medical History  Diagnosis Date  . Hypothyroidism 08/16/2015  . Family history of MI (myocardial infarction) 08/16/2015    father  . Postmenopausal 08/16/2015  . Cardiac risk counseling 08/17/2015    ASCVD 10 year risk 6.7% calculated 08/2015    Past Surgical History  Procedure Laterality Date  . Appendectomy  1963  . Revision of scar tissue rectus muscle  2011    There were no vitals filed for this visit.  Visit Diagnosis:  Arthralgia of right lower leg  Abdominal weakness  Weakness of back  Bilateral low back pain without sciatica      Subjective Assessment - 09/11/15 0936    Subjective Pt reports her trip to [place] went well; used pillow and it helped.  Gardening and lifting/ raking stirred up Rt leg, not back. HEP going well. Still dealing with sciatica at night/ some mornings in [place].    Patient Stated Goals patient wishes to have leg pain relief or learn something she can do to help it out. Currently able to perform all her activity however has pain with these.   Currently in Pain? Yes   Pain Score 3    Pain Location Leg   Pain Orientation Right  (shin)   Pain Descriptors / Indicators Aching   Aggravating Factors  walking, prolonged standing, pushing gas pedal   Pain Relieving Factors sitting, heating pad             [facility] PT Assessment - 09/11/15 0001    Assessment   Medical Diagnosis Low back pain and Rt lateral leg pain   Referring Provider Dr. Lyn Hollingshead    Onset Date/Surgical Date 08/07/15   Hand Dominance Right   Next MD Visit PRN   Prior Therapy none           [facility] Adult PT Treatment/Exercise - 09/11/15 0001    Lumbar Exercises: Stretches   Passive Hamstring Stretch 30 seconds;2 reps  each with strap   Lower Trunk Rotation 2 reps;20 seconds  arms in T, repeated each side.    ITB Stretch 30 seconds;2 reps  with strap   Lumbar Exercises: Aerobic   Stationary Bike NuStep L4 x 5.5 min    Lumbar Exercises: Supine   Bridge Limitations 10 reps, 5 sec hold with feet on ball.    Other Supine Lumbar Exercises pilates Hundred (20 reps x 4 sets)   Lumbar Exercises: Sidelying   Clam 15 reps  core engaged   Knee/Hip Exercises: Stretches   Piriformis Stretch Right;Left;2 reps;30 seconds   Gastroc Stretch Right;Left;2 reps;20 seconds   Soleus Stretch Right;Left;2 reps;20 seconds   Other Knee/Hip Stretches seated piriformis stretches x 20 sec x 2 reps each side.            PT Education - 09/11/15 0950    Education provided Yes  Education Details HEP   Person(s) Educated Patient   Methods Explanation;Demonstration   Comprehension Verbalized understanding;Returned demonstration             PT Long Term Goals - 09/04/15 0946    PT LONG TERM GOAL #1   Title I with HEP (09/18/15)    Status On-going   PT LONG TERM GOAL #2   Title report pain decrease in Rt lower leg =/> 75% with daily activity (09/18/15)    Status Achieved  Pt states 75% improvement overall   PT LONG TERM GOAL #3   Title report back pain decrease =/> 75% with walking (09/18/15)    Status On-going   PT LONG TERM GOAL #4   Title perform core exercise with good contraction of TA and multifidis (09/18/15)    Status On-going   PT LONG TERM GOAL #5   Title improve FOTO =/< 29% limited CJ level (09/18/15)    Status On-going               Plan  - 09/11/15 1017    Clinical Impression Statement Pt tolerated treatment well without increase in pain, declined use of modalities due to lack of pain by end of session.  Progressing well towards remaining goals.    Pt will benefit from skilled therapeutic intervention in order to improve on the following deficits Increased muscle spasms;Pain;Difficulty walking;Decreased strength   Rehab Potential Excellent   PT Frequency 2x / week   PT Duration 4 weeks   PT Treatment/Interventions Vasopneumatic Device;Manual techniques;Therapeutic exercise;Moist Heat;Iontophoresis 4mg /ml Dexamethasone;Electrical Stimulation;Cryotherapy;Dry needling;Passive range of motion;Patient/family education;Gait training;Balance training;Ultrasound   PT Next Visit Plan Continue progressive core strengthening/ hip strengthening.    Consulted and Agree with Plan of Care Patient        Problem List Patient Active Problem List   Diagnosis Date Noted  . Cardiac risk counseling 08/17/2015  . Foot pain, right 08/17/2015  . Hypothyroidism 08/16/2015  . History of appendectomy 08/16/2015  . Family history of MI (myocardial infarction) 08/16/2015  . Postmenopausal 08/16/2015  . Chronic low back pain 08/16/2015  . Leg pain, lateral 08/16/2015    Mayer CamelJennifer Carlson-Long, PTA 09/11/2015 10:22 AM  [facility] [place] [address] [place], [place], [address] Phone: [phone]   Fax:  [phone]  Name: Hailey Parrish MRN: [id number] Date of Birth: 1946-02-25

## 2015-09-11 NOTE — Patient Instructions (Addendum)
The Hundred    Lie on back, legs bent, arms toward ceiling. Exhale, pressing arms down to sides, curling up head and upper torso. Hold. Pump arms in small flutters up and down (20 x, repeat 4-5 times).  Once this is easy, lift both legs with bent knees.  http://pm.exer.us/1    Abduction: Clam (Eccentric) - Side-Lying    Lie on side with knees bent. Lift top knee, keeping feet together. Keep trunk steady. Slowly lower for 3-5 seconds. _10__ reps per set, _2__ sets session.    http://ecce.exer.us/65    Piriformis Stretch, Supine    Lie supine, folded towel under sacrum, one ankle crossed onto opposite knee. Holding bottom leg behind knee, gently pull legs toward chest and roll toward top-leg side. Feel stretch in hip or pelvic region. Hold __30_ seconds.  Repeat __2_ times per session. Do _1-2__ sessions per day. Use towel behind knee   [facility] [address] [place] [address] [place], [place] [address]  [phone] (office) [phone] (fax)

## 2015-09-13 ENCOUNTER — Ambulatory Visit (INDEPENDENT_AMBULATORY_CARE_PROVIDER_SITE_OTHER): Payer: Medicare Other | Admitting: Physical Therapy

## 2015-09-13 DIAGNOSIS — M25561 Pain in right knee: Secondary | ICD-10-CM | POA: Diagnosis present

## 2015-09-13 DIAGNOSIS — M545 Low back pain, unspecified: Secondary | ICD-10-CM

## 2015-09-13 DIAGNOSIS — M6281 Muscle weakness (generalized): Secondary | ICD-10-CM

## 2015-09-13 DIAGNOSIS — R198 Other specified symptoms and signs involving the digestive system and abdomen: Secondary | ICD-10-CM | POA: Diagnosis not present

## 2015-09-13 DIAGNOSIS — R29898 Other symptoms and signs involving the musculoskeletal system: Secondary | ICD-10-CM

## 2015-09-13 NOTE — Patient Instructions (Signed)

## 2015-09-13 NOTE — Therapy (Addendum)
[facility] [place] [address] [place], [place], [address] Phone: [phone]   Fax:  [phone]  Physical Therapy Treatment  Patient Details  Name: Hailey Parrish MRN: [id number] Date of Birth: 1946/06/12 Referring Provider: Dr. Lyn Hollingshead   Encounter Date: 09/13/2015      PT End of Session - 09/13/15 0944    Visit Number 7   Number of Visits 8   Date for PT Re-Evaluation 09/18/15   PT Start Time 0854   PT Stop Time 0954   PT Time Calculation (min) 60 min   Activity Tolerance Patient limited by pain      Past Medical History  Diagnosis Date  . Hypothyroidism 08/16/2015  . Family history of MI (myocardial infarction) 08/16/2015    father  . Postmenopausal 08/16/2015  . Cardiac risk counseling 08/17/2015    ASCVD 10 year risk 6.7% calculated 08/2015    Past Surgical History  Procedure Laterality Date  . Appendectomy  1963  . Revision of scar tissue rectus muscle  2011    There were no vitals filed for this visit.  Visit Diagnosis:  Arthralgia of right lower leg  Abdominal weakness  Weakness of back  Bilateral low back pain without sciatica                       [facility] Adult PT Treatment/Exercise - 09/13/15 0001    Lumbar Exercises: Stretches   Double Knee to Chest Stretch 30 seconds   ITB Stretch 30 seconds;2 reps  cross body with strap   Piriformis Stretch 30 seconds   Lumbar Exercises: Prone   Other Prone Lumbar Exercises 10 reps pelvic presses, then with hip ext   Lumbar Exercises: Quadruped   [place] 15 reps   Modalities   Modalities Electrical Stimulation;Moist Heat   Moist Heat Therapy   Number Minutes Moist Heat 15 Minutes   Moist Heat Location --  buttocks   Electrical Stimulation   Electrical Stimulation Location Rt buttocks   Electrical Stimulation Action IFC   Electrical Stimulation Parameters to tolerance   Electrical Stimulation Goals Pain   Manual Therapy   Manual Therapy Soft tissue mobilization;Myofascial release   Soft tissue mobilization to Rt piriformis  with passive release           Trigger Point Dry Needling - 09/13/15 0912    Consent Given? Yes   Education Handout Provided No   Muscles Treated Upper Body Piriformis  Rt                   PT Long Term Goals - 09/04/15 0946    PT LONG TERM GOAL #1   Title I with HEP (09/18/15)    Status On-going   PT LONG TERM GOAL #2   Title report pain decrease in Rt lower leg =/> 75% with daily activity (09/18/15)    Status Achieved  Pt states 75% improvement overall   PT LONG TERM GOAL #3   Title report back pain decrease =/> 75% with walking (09/18/15)    Status On-going   PT LONG TERM GOAL #4   Title perform core exercise with good contraction of TA and multifidis (09/18/15)    Status On-going   PT LONG TERM GOAL #5   Title improve FOTO =/< 29% limited CJ level (09/18/15)    Status On-going               Plan - 09/13/15 0945  Clinical Impression Statement Pt with flare up of Rt buttock pain over the last couple of days,  Very tight in her piriformis. Had good release with TDN and manual work.    Pt will benefit from skilled therapeutic intervention in order to improve on the following deficits Increased muscle spasms;Pain;Difficulty walking;Decreased strength   Rehab Potential Excellent   PT Frequency 2x / week   PT Duration 4 weeks   PT Treatment/Interventions Vasopneumatic Device;Manual techniques;Therapeutic exercise;Moist Heat;Iontophoresis 4mg /ml Dexamethasone;Electrical Stimulation;Cryotherapy;Dry needling;Passive range of motion;Patient/family education;Gait training;Balance training;Ultrasound   PT Next Visit Plan reassess   Consulted and Agree with Plan of Care Patient        Problem List Patient Active Problem List   Diagnosis Date Noted  . Cardiac risk counseling 08/17/2015  . Foot pain, right 08/17/2015  . Hypothyroidism 08/16/2015  .  History of appendectomy 08/16/2015  . Family history of MI (myocardial infarction) 08/16/2015  . Postmenopausal 08/16/2015  . Chronic low back pain 08/16/2015  . Leg pain, lateral 08/16/2015    Roderic ScarceSusan Malaika Arnall PT 09/13/2015, 9:54 AM  [facility] [address] [place] [address] [place], [place], [address] Phone: [phone]   Fax:  [phone]  Name: Hailey Parrish MRN: [id number] Date of Birth: [birth year]

## 2015-09-19 ENCOUNTER — Ambulatory Visit (INDEPENDENT_AMBULATORY_CARE_PROVIDER_SITE_OTHER): Payer: Medicare Other | Admitting: Physical Therapy

## 2015-09-19 DIAGNOSIS — M6281 Muscle weakness (generalized): Secondary | ICD-10-CM | POA: Diagnosis not present

## 2015-09-19 DIAGNOSIS — R198 Other specified symptoms and signs involving the digestive system and abdomen: Secondary | ICD-10-CM | POA: Diagnosis not present

## 2015-09-19 DIAGNOSIS — M25561 Pain in right knee: Secondary | ICD-10-CM | POA: Diagnosis present

## 2015-09-19 DIAGNOSIS — R29898 Other symptoms and signs involving the musculoskeletal system: Secondary | ICD-10-CM

## 2015-09-19 NOTE — Therapy (Signed)
[place], [place], [address] Phone: [phone]   Fax:  [phone]  Physical Therapy Treatment  Patient Details  Name: Hailey Parrish MRN: [id number] Date of Birth: Jul 18, 1946 Referring Provider: Dr Sheppard Coil  Encounter Date: 09/19/2015      PT End of Session - 09/19/15 0856    Visit Number 8   Number of Visits 8   Date for PT Re-Evaluation 09/18/15   PT Start Time 0853   PT Stop Time 0936   PT Time Calculation (min) 43 min   Activity Tolerance Patient tolerated treatment well      Past Medical History  Diagnosis Date  . Hypothyroidism 08/16/2015  . Family history of MI (myocardial infarction) 08/16/2015    father  . Postmenopausal 08/16/2015  . Cardiac risk counseling 08/17/2015    ASCVD 10 year risk 6.7% calculated 08/2015    Past Surgical History  Procedure Laterality Date  . Appendectomy  1963  . Revision of scar tissue rectus muscle  2011    There were no vitals filed for this visit.  Visit Diagnosis:  Arthralgia of right lower leg  Abdominal weakness  Weakness of back      Subjective Assessment - 09/19/15 0856    Subjective She reports she is able to do her activity however had intermittent pain. Patient feels like she is ready for D/C and feels like she will perform her HEP.  If the pain returns she will return to MD and possibly come back.    Currently in Pain? No/denies  Had pain yesterday however she had a very busy day.             [facility] PT Assessment - 09/19/15 0001    Assessment   Medical Diagnosis Low back pain and Rt lateral leg pain   Referring Provider Dr Sheppard Coil   Onset Date/Surgical Date 08/07/15   Hand Dominance Right   Next MD Visit PRN   Observation/Other Assessments   Focus on Therapeutic Outcomes (FOTO)  30% limited   Functional Tests   Functional tests Squat   Squat   Comments good alignement of bilat thigh, slight weight shift to the left    AROM   Overall AROM Comments lumbar WNL   Strength   Lumbar Flexion --  TA good (-)                      [facility] Adult PT Treatment/Exercise - 09/19/15 0001    Lumbar Exercises: Aerobic   Stationary Bike L3x5'   Lumbar Exercises: Supine   Bridge 10 reps  articulating.    Straight Leg Raise 20 reps  with TA contraction   Lumbar Exercises: Prone   Opposite Arm/Leg Raise Right arm/Left leg;Left arm/Right leg;20 reps   Modalities   Modalities Electrical Stimulation;Moist Heat   Moist Heat Therapy   Number Minutes Moist Heat 15 Minutes   Moist Heat Location Lumbar Spine  buttocks   Electrical Stimulation   Electrical Stimulation Location Rt buttocks   Electrical Stimulation Action IFC   Electrical Stimulation Parameters  to tolerance   Electrical Stimulation Goals Pain                     PT Long Term Goals - 09/19/15 0902    PT LONG TERM GOAL #1   Title I with HEP (09/18/15)    Status Achieved   PT LONG TERM GOAL #2   Title report pain  decrease in Rt lower leg =/> 75% with daily activity (09/18/15)    Status Achieved   PT LONG TERM GOAL #3   Title report back pain decrease =/> 75% with walking (09/18/15)    Status Achieved  however hasn't begun formal walking program.    PT LONG TERM GOAL #4   Title perform core exercise with good contraction of TA and multifidis (09/18/15)    Status Achieved   PT LONG TERM GOAL #5   Title improve FOTO =/< 29% limited CJ level (09/18/15)    Status Not Met  progressing, scored 30% limited               Plan - 09/19/15 0923    Clinical Impression Statement Pt is doing very well, has met most of her goals and knows that the rest is up to her to perform her HEP.  She is ready for D/C    Consulted and Agree with Plan of Care Patient        Problem List Patient Active Problem List   Diagnosis Date Noted  . Cardiac risk counseling 08/17/2015  . Foot pain, right 08/17/2015  . Hypothyroidism  08/16/2015  . History of appendectomy 08/16/2015  . Family history of MI (myocardial infarction) 08/16/2015  . Postmenopausal 08/16/2015  . Chronic low back pain 08/16/2015  . Leg pain, lateral 08/16/2015    Nyeem Stoke,SUE 09/19/2015, 9:24 AM  [facility] [place] [address] [place], [place], [address] Phone: [phone]   Fax:  [phone]  Name: Hailey Parrish MRN: [id number] Date of Birth: 1945/10/27  PHYSICAL THERAPY DISCHARGE SUMMARY  Visits from Start of Care: 8  Current functional level related to goals / functional outcomes: Full ROM, increased strength.    Remaining deficits: Occasional discomfort on very active days.     Education / Equipment: HEP  Plan: Patient agrees to discharge.  Patient goals were partially met. Patient is being discharged due to being pleased with the current functional level.  ?????       Jeral Pinch, PT 09/19/2015 9:25 AM

## 2015-11-16 ENCOUNTER — Encounter: Payer: Self-pay | Admitting: Osteopathic Medicine

## 2015-11-16 ENCOUNTER — Ambulatory Visit (INDEPENDENT_AMBULATORY_CARE_PROVIDER_SITE_OTHER): Payer: Medicare Other | Admitting: Osteopathic Medicine

## 2015-11-16 VITALS — BP 134/74 | HR 72 | Ht 62.5 in | Wt 155.0 lb

## 2015-11-16 DIAGNOSIS — Z13220 Encounter for screening for lipoid disorders: Secondary | ICD-10-CM | POA: Diagnosis not present

## 2015-11-16 DIAGNOSIS — Z8249 Family history of ischemic heart disease and other diseases of the circulatory system: Secondary | ICD-10-CM | POA: Diagnosis not present

## 2015-11-16 DIAGNOSIS — Z23 Encounter for immunization: Secondary | ICD-10-CM | POA: Diagnosis not present

## 2015-11-16 DIAGNOSIS — Z0000 Encounter for general adult medical examination without abnormal findings: Secondary | ICD-10-CM | POA: Diagnosis not present

## 2015-11-16 DIAGNOSIS — E785 Hyperlipidemia, unspecified: Secondary | ICD-10-CM | POA: Diagnosis not present

## 2015-11-16 DIAGNOSIS — Z780 Asymptomatic menopausal state: Secondary | ICD-10-CM

## 2015-11-16 DIAGNOSIS — Z1239 Encounter for other screening for malignant neoplasm of breast: Secondary | ICD-10-CM | POA: Diagnosis not present

## 2015-11-16 DIAGNOSIS — Z1211 Encounter for screening for malignant neoplasm of colon: Secondary | ICD-10-CM

## 2015-11-16 DIAGNOSIS — E039 Hypothyroidism, unspecified: Secondary | ICD-10-CM | POA: Diagnosis not present

## 2015-11-16 LAB — HEPATIC FUNCTION PANEL
ALBUMIN: 4.3 g/dL (ref 3.6–5.1)
ALT: 12 U/L (ref 6–29)
AST: 16 U/L (ref 10–35)
Alkaline Phosphatase: 68 U/L (ref 33–130)
BILIRUBIN INDIRECT: 0.6 mg/dL (ref 0.2–1.2)
Bilirubin, Direct: 0.1 mg/dL (ref <=0.2)
TOTAL PROTEIN: 7.1 g/dL (ref 6.1–8.1)
Total Bilirubin: 0.7 mg/dL (ref 0.2–1.2)

## 2015-11-16 LAB — BASIC METABOLIC PANEL WITH GFR
BUN: 18 mg/dL (ref 7–25)
CALCIUM: 9.7 mg/dL (ref 8.6–10.4)
CO2: 23 mmol/L (ref 20–31)
CREATININE: 0.81 mg/dL (ref 0.50–0.99)
Chloride: 105 mmol/L (ref 98–110)
GFR, EST AFRICAN AMERICAN: 87 mL/min (ref >=60)
GFR, EST NON AFRICAN AMERICAN: 75 mL/min (ref >=60)
GLUCOSE: 87 mg/dL (ref 65–99)
Potassium: 4.6 mmol/L (ref 3.5–5.3)
Sodium: 141 mmol/L (ref 135–146)

## 2015-11-16 LAB — LIPID PANEL
CHOLESTEROL: 212 mg/dL — AB (ref 125–200)
HDL: 50 mg/dL (ref >=46)
LDL Cholesterol: 145 mg/dL — ABNORMAL HIGH (ref <130)
TRIGLYCERIDES: 83 mg/dL (ref <150)
Total CHOL/HDL Ratio: 4.2 Ratio (ref <=5.0)
VLDL: 17 mg/dL (ref <30)

## 2015-11-16 LAB — THYROID PANEL WITH TSH
FREE THYROXINE INDEX: 3.3 (ref 1.4–3.8)
T3 UPTAKE: 31 % (ref 22–35)
T4, Total: 10.5 ug/dL (ref 4.5–12.0)
TSH: 0.37 m[IU]/L — AB

## 2015-11-16 MED ORDER — ZOSTER VACCINE LIVE 19400 UNT/0.65ML ~~LOC~~ SOLR: 0.6500 mL | Freq: Once | SUBCUTANEOUS | Status: DC

## 2015-11-16 NOTE — Progress Notes (Signed)
Subjective:    Hailey Parrish is a 70 y.o. female who presents for Medicare Annual/Subsequent preventive examination.  Preventive Screening-Counseling & Management  Tobacco History  Smoking status  . Never Smoker   Smokeless tobacco  . 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 Cardiac risk counseling 08/17/2015  . Foot pain, right 08/17/2015  . Hypothyroidism 08/16/2015  . History of appendectomy 08/16/2015  . Family history of MI (myocardial infarction) 08/16/2015  . Postmenopausal 08/16/2015  . Chronic low back pain 08/16/2015  . Leg pain, lateral 08/16/2015    Medications Prior to Visit Current Outpatient Prescriptions on File Prior to Visit  Medication Sig Dispense Refill  . diclofenac sodium (VOLTAREN) 1 % GEL Apply 2 g topically 4 (four) times daily as needed (muscle pain). PLEASE FILL RX 100 g 3  . levothyroxine (SYNTHROID, LEVOTHROID) 112 MCG tablet Take 1 tablet (112 mcg total) by mouth daily before breakfast. PLEASE FILL RX 60 tablet 0  . meloxicam (MOBIC) 7.5 MG tablet Take 1 tablet (7.5 mg total) by mouth daily. 30 tablet 3  . valACYclovir (VALTREX) 500 MG tablet Take 500 mg by mouth 2 (two) times daily.     No current facility-administered medications on file prior to visit.    Current Medications (verified) Current Outpatient Prescriptions  Medication Sig Dispense Refill  . diclofenac sodium (VOLTAREN) 1 % GEL Apply 2 g topically 4 (four) times daily as needed (muscle pain). PLEASE FILL RX 100 g 3  . levothyroxine (SYNTHROID, LEVOTHROID) 112 MCG tablet Take 1 tablet (112 mcg total) by mouth daily before breakfast. PLEASE FILL RX 60 tablet 0  . meloxicam (MOBIC) 7.5 MG tablet Take 1 tablet (7.5 mg total) by mouth daily. 30 tablet 3  . valACYclovir (VALTREX) 500 MG tablet Take 500 mg by mouth 2 (two)  times daily.     No current facility-administered medications for this visit.     Allergies (verified) Review of patient's allergies indicates no known allergies.   PAST HISTORY  Family History Family History  Problem Relation Age of Onset  . Hyperlipidemia Mother   . Cancer Father     lung  . Heart attack Father     Social History Social History  Substance Use Topics  . Smoking status: Never Smoker   . Smokeless tobacco: Not on file  . Alcohol Use: Not on file     Are there smokers in your home (other than you)? No  Risk Factors Current exercise habits: The patient does not participate in regular exercise at present.  Dietary issues discussed: yes   Cardiac risk factors: advanced age (older than 68 for men, 12 for women) and dyslipidemia.  Depression Screen (Note: if answer to either of the following is "Yes", a more complete depression screening is indicated)   Over the past two weeks, have you felt down, depressed or hopeless? No  Over the past two weeks, have you felt little interest or pleasure in doing things? No  Have you lost interest or pleasure in daily life? No  Do you often feel hopeless? No  Do you cry easily over simple problems? No  Activities of Daily Living In your present state of health, do you have any difficulty performing the following activities?:  Driving? No Managing money?  No Feeding yourself? No Getting from bed to chair?  No   Climbing a flight of stairs? No Preparing food and eating?: No Bathing or showering? No Getting dressed: No Getting to the toilet? No Using the toilet:No Moving around from place to place: No In the past year have you fallen or had a near fall?:No   Are you sexually active?  Yes  Do you have more than one partner?  No  Hearing Difficulties: No Do you often ask people to speak up or repeat themselves? No Do you experience ringing or noises in your ears? occasoinally Do you have difficulty understanding  soft or whispered voices? No   Do you feel that you have a problem with memory? No  Do you often misplace items? No  Do you feel safe at home?  Yes  Cognitive Testing  Alert? Yes  Normal Appearance?Yes  Oriented to person? Yes  Place? Yes   Time? Yes  Displays appropriate judgment?Yes  Can read the correct time from a watch face?Yes   Advanced Directives have been discussed with the patient? Yes  List the Names of Other Physician/Practitioners you currently use: 1.  none  Indicate any recent Medical Services you may have received from other than [facility] providers in the past year (date may be approximate).  Immunization History  Administered Date(s) Administered  . Influenza-Unspecified 07/22/2015  . Pneumococcal Polysaccharide-23 10/26/2013, 11/01/2014  . Td 08/30/2005  . Tdap 04/04/2010    Screening Tests Health Maintenance  Topic Date Due  . Hepatitis C Screening  October 03, 1946  . ZOSTAVAX  06/26/2008  . PNA vac Low Risk Adult (2 of 2 - PCV13) 11/02/2015  . INFLUENZA VACCINE  05/07/2016  . MAMMOGRAM  11/14/2016  . COLONOSCOPY  10/07/2017  . TETANUS/TDAP  04/04/2020  . DEXA SCAN  Completed    All answers were reviewed with the patient and necessary referrals were made:  Sunnie Nielsen, DO   11/16/2015   History reviewed: allergies, current medications, past family history, past medical history, past social history, past surgical history and problem list  Review of Systems Pertinent items noted in HPI and remainder of comprehensive ROS otherwise negative.    Objective:     Vision by Snellen chart: right ZOX:WRUEAVW declines measurement, left UJW:JXBJYNW declines measurement  Body mass index is 27.88 kg/(m^2). BP 134/74 mmHg  Pulse 72  Ht 5' 2.5" (1.588 m)  Wt 155 lb (70.308 kg)  BMI 27.88 kg/m2  No exam performed today, follows with GYN.     Assessment:     Routine history and physical examination of adult  Annual physical exam - Plan: Basic Metabolic  Panel w/GFR, Hepatic function panel, Hepatitis C antibody screen, Lipid panel  Family history of MI (myocardial infarction)  Lipid screening  Need for zoster vaccination - Plan: zoster vaccine live, PF, (ZOSTAVAX) 29562 UNT/0.65ML injection  Hypothyroidism, unspecified hypothyroidism type - Plan: Thyroid Panel With TSH  Hyperlipidemia - Plan: Lipid panel  Postmenopausal        Plan:     During the course of the visit the patient was educated and counseled about appropriate screening and preventive services including: see above    Diet review for nutrition referral? Yes ____  Not Indicated _X___   Patient Instructions (the written plan) was given to the patient.  Medicare Attestation I have personally reviewed: The patient's medical and social history Their use of alcohol, tobacco or illicit drugs Their current medications and supplements The patient's functional ability including ADLs,fall risks, home safety risks, cognitive, and hearing and visual impairment  Diet and physical activities Evidence for depression or mood disorders  The patient's weight, height, BMI, and visual acuity have been recorded in the chart.  I have made referrals, counseling, and provided education to the patient based on review of the above and I have provided the patient with a written personalized care plan for preventive services.     Sunnie Nielsen, DO   11/16/2015     FEMALE PREVENTIVE CARE:  ANNUAL SCREENING/COUNSELING Tobacco - Never  Alcohol - social drinker Diet/Exercise - HEALTHY HABITS DISCUSSED TO DECREASE CV RISK Sexual Health - Yes with female. STI - The patient denies history of sexually transmitted disease. INTERESTED IN STI TESTING - no Depression - PQH2 Negative Domestic violence concerns - no HTN SCREENING - SEE VITALS Vaccination status - SEE BELOW  INFECTIOUS DISEASE SCREENING HIV - all adults 15-65 - does not need GC/CT - sexually active - does not need HepC -  born 79-1963 - needs TB - if risk/required by employer - does not need  DISEASE SCREENING Lipid - (Low risk screen M35/F45; High risk screen M25/F35 if HTN, Tob, FH CHD M<55/F<65) - does not need DM2 (45+ or Risk = FH 1st deg DM, Hx GDM, overweight/sedentary, high-risk ethnicity, HTN) - needs Osteoporosis - age 11+ or one sooner if risk - does not need  CANCER SCREENING Cervical - Pap q3 yr age 75+, Pap + HPV q5y age 62+ - PAP - does not need Breast - Mammo age 89+ (C) and biennial age 47-75 (A) - MAMMO - does not need Lung - annual low dose CT Chest age 41-75 w/ 30+ PY, current/quit past 15 years - CT - does not need Colon - age 30+ or 68 years of age prior to FH Dx - GI REFERRAL - does not need  ADULT VACCINATION Influenza - annual - already has Td booster every 10 years - already has HPV - age <9yo - was not indicated Zoster - age 13+ - was given Pneumonia - age 11+ sooner if risk (DM, smoker, other) - already has  OTHER Fall - exercise and Vit D age 11+ - does not need Consider ASA - age 34-59 - does not need

## 2015-11-17 ENCOUNTER — Other Ambulatory Visit: Payer: Self-pay | Admitting: Osteopathic Medicine

## 2015-11-17 DIAGNOSIS — E039 Hypothyroidism, unspecified: Secondary | ICD-10-CM

## 2015-11-17 LAB — HEPATITIS C ANTIBODY: HCV AB: NEGATIVE

## 2015-11-17 MED ORDER — LEVOTHYROXINE SODIUM 100 MCG PO TABS: 100.0000 ug | ORAL_TABLET | Freq: Every day | ORAL | Status: DC

## 2015-12-06 DIAGNOSIS — E039 Hypothyroidism, unspecified: Secondary | ICD-10-CM | POA: Diagnosis not present

## 2015-12-07 LAB — TSH: TSH: 0.27 m[IU]/L — AB

## 2016-01-08 ENCOUNTER — Telehealth: Payer: Self-pay

## 2016-01-08 DIAGNOSIS — E039 Hypothyroidism, unspecified: Secondary | ICD-10-CM

## 2016-01-08 NOTE — Telephone Encounter (Signed)
Patient called wanted to know when to repeat labs, she was advised to repeat 8 weeks after starting the medcation on the new dose. TSH orders were placed for a future order. Rhonda Cunningham,CMA

## 2016-01-15 DIAGNOSIS — E039 Hypothyroidism, unspecified: Secondary | ICD-10-CM | POA: Diagnosis not present

## 2016-01-16 LAB — TSH: TSH: 0.88 mIU/L

## 2016-01-22 ENCOUNTER — Telehealth: Payer: Self-pay | Admitting: *Deleted

## 2016-01-22 DIAGNOSIS — Z1231 Encounter for screening mammogram for malignant neoplasm of breast: Secondary | ICD-10-CM

## 2016-01-22 NOTE — Telephone Encounter (Signed)
Put order in for mammogram downstairs

## 2016-02-02 ENCOUNTER — Ambulatory Visit (INDEPENDENT_AMBULATORY_CARE_PROVIDER_SITE_OTHER): Payer: Medicare Other

## 2016-02-02 DIAGNOSIS — Z1231 Encounter for screening mammogram for malignant neoplasm of breast: Secondary | ICD-10-CM | POA: Diagnosis not present

## 2016-02-12 ENCOUNTER — Encounter: Payer: Self-pay | Admitting: Family Medicine

## 2016-02-12 ENCOUNTER — Ambulatory Visit (INDEPENDENT_AMBULATORY_CARE_PROVIDER_SITE_OTHER): Payer: Medicare Other | Admitting: Family Medicine

## 2016-02-12 VITALS — BP 128/70 | HR 62 | Wt 153.0 lb

## 2016-02-12 DIAGNOSIS — R109 Unspecified abdominal pain: Secondary | ICD-10-CM

## 2016-02-12 DIAGNOSIS — N3000 Acute cystitis without hematuria: Secondary | ICD-10-CM | POA: Diagnosis not present

## 2016-02-12 LAB — POCT URINALYSIS DIPSTICK
BILIRUBIN UA: NEGATIVE
GLUCOSE UA: NEGATIVE
Ketones, UA: NEGATIVE
NITRITE UA: POSITIVE
Protein, UA: NEGATIVE
RBC UA: NEGATIVE
Spec Grav, UA: 1.02
UROBILINOGEN UA: 0.2
pH, UA: 7

## 2016-02-12 MED ORDER — CEPHALEXIN 500 MG PO CAPS: 500.0000 mg | ORAL_CAPSULE | Freq: Two times a day (BID) | ORAL | Status: DC

## 2016-02-12 NOTE — Progress Notes (Signed)
       Hailey Parrish is a 70 y.o. female who [facility] today for UTI. Patient notes a one-day history of urinary frequency urgency and dysuria. Symptoms are consistent with prior history of UTI. No fevers chills nausea vomiting diarrhea. Patient notes mild pelvic pain/soreness. No treatment tried yet.   Past Medical History  Diagnosis Date  . Hypothyroidism 08/16/2015  . Family history of MI (myocardial infarction) 08/16/2015    father  . Postmenopausal 08/16/2015  . Cardiac risk counseling 08/17/2015    ASCVD 10 year risk 6.7% calculated 08/2015   Past Surgical History  Procedure Laterality Date  . Appendectomy  1963  . Revision of scar tissue rectus muscle  2011   Social History  Substance Use Topics  . Smoking status: Never Smoker   . Smokeless tobacco: Not on file  . Alcohol Use: Not on file   family history includes Cancer in her father; Heart attack in her father; Hyperlipidemia in her mother.  ROS as above Medications: Current Outpatient Prescriptions  Medication Sig Dispense Refill  . levothyroxine (SYNTHROID, LEVOTHROID) 100 MCG tablet Take 1 tablet (100 mcg total) by mouth daily before breakfast. 30 tablet 1  . meloxicam (MOBIC) 7.5 MG tablet Take 1 tablet (7.5 mg total) by mouth daily. (Patient taking differently: Take 7.5 mg by mouth as needed. ) 30 tablet 3  . valACYclovir (VALTREX) 500 MG tablet Take 500 mg by mouth daily.     Marland Kitchen. zoster vaccine live, PF, (ZOSTAVAX) [id number] UNT/0.65ML injection Inject 19,400 Units into the skin once. 1 each 0  . cephALEXin (KEFLEX) 500 MG capsule Take 1 capsule (500 mg total) by mouth 2 (two) times daily. 14 capsule 0   No current facility-administered medications for this visit.   No Known Allergies   Exam:  BP 128/70 mmHg  Pulse 62  Wt 153 lb (69.4 kg) Gen: Well NAD HEENT: EOMI,  MMM Lungs: Normal work of breathing.  CTABL Heart: RRR no MRG Abd: NABS, Soft. Nondistended, NontenderNo CVA angle tenderness to percussion Exts: Brisk capillary refill, warm and well perfused.   Results for orders placed or performed in visit on 02/12/16 (from the past 24 hour(s))  POCT Urinalysis Dipstick     Status: Abnormal   Collection Time: 02/12/16  3:45 PM  Result Value Ref Range   Color, UA yellow    Clarity, UA clear    Glucose, UA negative    Bilirubin, UA negative    Ketones, UA negative    Spec Grav, UA 1.020    Blood, UA negative    pH, UA 7.0    Protein, UA negative    Urobilinogen, UA 0.2    Nitrite, UA positive    Leukocytes, UA Trace (A) Negative   No results found.    70 year old woman with UTI. Culture pending. Treat with Keflex empirically.

## 2016-02-12 NOTE — Patient Instructions (Signed)
Thank you for coming in today. Take keflex twice daily.  Return as needed.   Urinary Tract Infection Urinary tract infections (UTIs) can develop anywhere along your urinary tract. Your urinary tract is your body's drainage system for removing wastes and extra water. Your urinary tract includes two kidneys, two ureters, a bladder, and a urethra. Your kidneys are a pair of bean-shaped organs. Each kidney is about the size of your fist. They are located below your ribs, one on each side of your spine. CAUSES Infections are caused by microbes, which are microscopic organisms, including fungi, viruses, and bacteria. These organisms are so small that they can only be seen through a microscope. Bacteria are the microbes that most commonly cause UTIs. SYMPTOMS  Symptoms of UTIs may vary by age and gender of the patient and by the location of the infection. Symptoms in young women typically include a frequent and intense urge to urinate and a painful, burning feeling in the bladder or urethra during urination. Older women and men are more likely to be tired, shaky, and weak and have muscle aches and abdominal pain. A fever may mean the infection is in your kidneys. Other symptoms of a kidney infection include pain in your back or sides below the ribs, nausea, and vomiting. DIAGNOSIS To diagnose a UTI, your caregiver will ask you about your symptoms. Your caregiver will also ask you to provide a urine sample. The urine sample will be tested for bacteria and white blood cells. White blood cells are made by your body to help fight infection. TREATMENT  Typically, UTIs can be treated with medication. Because most UTIs are caused by a bacterial infection, they usually can be treated with the use of antibiotics. The choice of antibiotic and length of treatment depend on your symptoms and the type of bacteria causing your infection. HOME CARE INSTRUCTIONS  If you were prescribed antibiotics, take them exactly as  your caregiver instructs you. Finish the medication even if you feel better after you have only taken some of the medication.  Drink enough water and fluids to keep your urine clear or pale yellow.  Avoid caffeine, tea, and carbonated beverages. They tend to irritate your bladder.  Empty your bladder often. Avoid holding urine for long periods of time.  Empty your bladder before and after sexual intercourse.  After a bowel movement, women should cleanse from front to back. Use each tissue only once. SEEK MEDICAL CARE IF:   You have back pain.  You develop a fever.  Your symptoms do not begin to resolve within 3 days. SEEK IMMEDIATE MEDICAL CARE IF:   You have severe back pain or lower abdominal pain.  You develop chills.  You have nausea or vomiting.  You have continued burning or discomfort with urination. MAKE SURE YOU:   Understand these instructions.  Will watch your condition.  Will get help right away if you are not doing well or get worse.   This information is not intended to replace advice given to you by your health care provider. Make sure you discuss any questions you have with your health care provider.   Document Released: 07/03/2005 Document Revised: 06/14/2015 Document Reviewed: 11/01/2011 Elsevier Interactive Patient Education [employer]2016 Elsevier Inc.

## 2016-02-14 LAB — URINE CULTURE

## 2016-02-16 ENCOUNTER — Encounter: Payer: Self-pay | Admitting: Family Medicine

## 2016-04-25 ENCOUNTER — Other Ambulatory Visit: Payer: Self-pay | Admitting: Osteopathic Medicine

## 2016-04-26 NOTE — Telephone Encounter (Signed)
This is a historical medication and has to be routed to provider; patient called and states she has been trying to get this filled for several days

## 2016-04-26 NOTE — Telephone Encounter (Signed)
rx sent to pharmacy

## 2016-05-15 ENCOUNTER — Other Ambulatory Visit: Payer: Self-pay

## 2016-05-15 DIAGNOSIS — E039 Hypothyroidism, unspecified: Secondary | ICD-10-CM

## 2016-05-16 LAB — TSH: TSH: 1.82 m[IU]/L

## 2016-06-25 ENCOUNTER — Other Ambulatory Visit: Payer: Self-pay | Admitting: Family Medicine

## 2016-07-22 ENCOUNTER — Other Ambulatory Visit: Payer: Self-pay | Admitting: Osteopathic Medicine

## 2016-07-30 DIAGNOSIS — Z23 Encounter for immunization: Secondary | ICD-10-CM | POA: Diagnosis not present

## 2016-08-13 DIAGNOSIS — N83202 Unspecified ovarian cyst, left side: Secondary | ICD-10-CM | POA: Diagnosis not present

## 2016-08-13 DIAGNOSIS — Z01411 Encounter for gynecological examination (general) (routine) with abnormal findings: Secondary | ICD-10-CM | POA: Diagnosis not present

## 2016-09-23 ENCOUNTER — Other Ambulatory Visit: Payer: Self-pay | Admitting: Osteopathic Medicine

## 2016-09-23 DIAGNOSIS — E039 Hypothyroidism, unspecified: Secondary | ICD-10-CM

## 2016-10-02 ENCOUNTER — Other Ambulatory Visit: Payer: Self-pay

## 2016-10-02 DIAGNOSIS — E039 Hypothyroidism, unspecified: Secondary | ICD-10-CM

## 2016-10-02 MED ORDER — LEVOTHYROXINE SODIUM 100 MCG PO TABS: 100.0000 ug | ORAL_TABLET | Freq: Every day | ORAL | 1 refills | Status: DC

## 2016-11-25 ENCOUNTER — Encounter: Payer: Self-pay | Admitting: Osteopathic Medicine

## 2016-11-25 ENCOUNTER — Ambulatory Visit (INDEPENDENT_AMBULATORY_CARE_PROVIDER_SITE_OTHER): Payer: Medicare Other | Admitting: Osteopathic Medicine

## 2016-11-25 VITALS — BP 135/79 | HR 74 | Ht 63.0 in | Wt 158.0 lb

## 2016-11-25 DIAGNOSIS — A6000 Herpesviral infection of urogenital system, unspecified: Secondary | ICD-10-CM | POA: Insufficient documentation

## 2016-11-25 DIAGNOSIS — Z23 Encounter for immunization: Secondary | ICD-10-CM | POA: Diagnosis not present

## 2016-11-25 DIAGNOSIS — Z780 Asymptomatic menopausal state: Secondary | ICD-10-CM | POA: Diagnosis not present

## 2016-11-25 DIAGNOSIS — E039 Hypothyroidism, unspecified: Secondary | ICD-10-CM

## 2016-11-25 DIAGNOSIS — Z0000 Encounter for general adult medical examination without abnormal findings: Secondary | ICD-10-CM | POA: Diagnosis not present

## 2016-11-25 LAB — CBC WITH DIFFERENTIAL/PLATELET
BASOS PCT: 0 %
Basophils Absolute: 0 cells/uL (ref 0–200)
EOS PCT: 4 %
Eosinophils Absolute: 204 cells/uL (ref 15–500)
HCT: 43.6 % (ref 35.0–45.0)
HEMOGLOBIN: 14 g/dL (ref 11.7–15.5)
LYMPHS ABS: 1581 {cells}/uL (ref 850–3900)
Lymphocytes Relative: 31 %
MCH: 29.5 pg (ref 27.0–33.0)
MCHC: 32.1 g/dL (ref 32.0–36.0)
MCV: 91.8 fL (ref 80.0–100.0)
MPV: 10.2 fL (ref 7.5–12.5)
Monocytes Absolute: 357 cells/uL (ref 200–950)
Monocytes Relative: 7 %
NEUTROS ABS: 2958 {cells}/uL (ref 1500–7800)
Neutrophils Relative %: 58 %
Platelets: 285 10*3/uL (ref 140–400)
RBC: 4.75 MIL/uL (ref 3.80–5.10)
RDW: 12.9 % (ref 11.0–15.0)
WBC: 5.1 10*3/uL (ref 3.8–10.8)

## 2016-11-25 LAB — COMPLETE METABOLIC PANEL WITH GFR
ALBUMIN: 4.4 g/dL (ref 3.6–5.1)
ALT: 12 U/L (ref 6–29)
AST: 18 U/L (ref 10–35)
Alkaline Phosphatase: 68 U/L (ref 33–130)
BUN: 14 mg/dL (ref 7–25)
CO2: 28 mmol/L (ref 20–31)
Calcium: 9.9 mg/dL (ref 8.6–10.4)
Chloride: 106 mmol/L (ref 98–110)
Creat: 0.9 mg/dL (ref 0.50–0.99)
GFR, Est African American: 76 mL/min (ref >=60)
GFR, Est Non African American: 66 mL/min (ref >=60)
GLUCOSE: 84 mg/dL (ref 65–99)
POTASSIUM: 4.4 mmol/L (ref 3.5–5.3)
SODIUM: 142 mmol/L (ref 135–146)
Total Bilirubin: 0.7 mg/dL (ref 0.2–1.2)
Total Protein: 7.1 g/dL (ref 6.1–8.1)

## 2016-11-25 LAB — LIPID PANEL
CHOL/HDL RATIO: 3.4 ratio (ref <5.0)
Cholesterol: 208 mg/dL — ABNORMAL HIGH (ref <200)
HDL: 61 mg/dL (ref >50)
LDL Cholesterol: 126 mg/dL — ABNORMAL HIGH (ref <100)
Triglycerides: 104 mg/dL (ref <150)
VLDL: 21 mg/dL (ref <30)

## 2016-11-25 LAB — TSH: TSH: 0.67 m[IU]/L

## 2016-11-25 MED ORDER — VALACYCLOVIR HCL 500 MG PO TABS: 500.0000 mg | ORAL_TABLET | Freq: Two times a day (BID) | ORAL | 1 refills | Status: DC

## 2016-11-25 MED ORDER — LEVOTHYROXINE SODIUM 100 MCG PO TABS: 100.0000 ug | ORAL_TABLET | Freq: Every day | ORAL | 1 refills | Status: DC

## 2016-11-25 NOTE — Progress Notes (Signed)
HPI: Hailey Parrish is a 71 y.o. female  who [facility] [place] [facility], 11/25/16,  for chief complaint of:  Chief Complaint  Patient presents with  . Annual Exam    Patient is here for annual physical exam. No complaints today.  Valtrex using as needed for genital HSV outbreaks. Requests refill  Hypothyroidism, last TSH within normal range. Requests refill.  Following with OB/GYN for mammograms, pelvic exams/ovarian cyst follow-up, she thinks she is getting her bone density tests through OB/GYN as well    Past medical, surgical, social and family history reviewed: Patient Active Problem List   Diagnosis Date Noted  . Cardiac risk counseling 08/17/2015  . Foot pain, right 08/17/2015  . Hypothyroidism 08/16/2015  . History of appendectomy 08/16/2015  . Family history of MI (myocardial infarction) 08/16/2015  . Postmenopausal 08/16/2015  . Chronic low back pain 08/16/2015  . Leg pain, lateral 08/16/2015   Past Surgical History:  Procedure Laterality Date  . APPENDECTOMY  1963  . REVISION OF SCAR TISSUE RECTUS MUSCLE  2011   Social History  Substance Use Topics  . Smoking status: Never Smoker  . Smokeless tobacco: Never Used  . Alcohol use Not on file   Family History  Problem Relation Age of Onset  . Hyperlipidemia Mother   . Cancer Father     lung  . Heart attack Father      Current medication list and allergy/intolerance information reviewed:   Current Outpatient Prescriptions  Medication Sig Dispense Refill  . levothyroxine (SYNTHROID, LEVOTHROID) 100 MCG tablet Take 1 tablet (100 mcg total) by mouth daily. 30 tablet 1  . meloxicam (MOBIC) 7.5 MG tablet Take 1 tablet (7.5 mg total) by mouth daily. (Patient taking differently: Take 7.5 mg by mouth as needed. ) 30 tablet 3  . valACYclovir (VALTREX) 500 MG tablet TAKE ONE TABLET BY MOUTH EVERY DAY 30 tablet 0  . zoster vaccine live, PF, (ZOSTAVAX) [id number] UNT/0.65ML injection  Inject 19,400 Units into the skin once. 1 each 0   No current facility-administered medications for this visit.    No Known Allergies    Review of Systems:  Constitutional:  No  fever, no chills, No recent illness, No unintentional weight changes. No significant fatigue.   HEENT: No  headache, no vision change, no hearing change, No sore throat, No  sinus pressure  Cardiac: No  chest pain, No  pressure, No palpitations, No  Orthopnea  Respiratory:  No  shortness of breath. No  Cough  Gastrointestinal: No  abdominal pain, No  nausea, No  vomiting,  No  blood in stool, No  diarrhea, No  constipation   Musculoskeletal: No new myalgia/arthralgia  Skin: No  Rash, No other wounds/concerning lesions  Hem/Onc: No  easy bruising/bleeding, No  abnormal lymph node  Endocrine: No cold intolerance,  No heat intolerance.  Neurologic: No  weakness, No  dizziness,  Psychiatric: No  concerns with depression, No  concerns with anxiety, No sleep problems, No mood problems  Exam:  BP 135/79   Pulse 74   Ht 5\' 3"  (1.6 m)   Wt 158 lb (71.7 kg)   BMI 27.99 kg/m   Constitutional: VS see above. General Appearance: alert, well-developed, well-nourished, NAD  Eyes: Normal lids and conjunctive, non-icteric sclera  Ears, Nose, Mouth, Throat: MMM, Normal external inspection ears/nares/mouth/lips/gums. TM normal bilaterally. Pharynx/tonsils no erythema, no exudate. Nasal mucosa normal.   Neck: No masses, trachea midline. No thyroid enlargement. No tenderness/mass appreciated.  No lymphadenopathy  Respiratory: Normal respiratory effort. no wheeze, no rhonchi, no rales  Cardiovascular: S1/S2 normal, no murmur, no rub/gallop auscultated. RRR. No lower extremity edema. Pedal pulse II/IV bilaterally DP and PT. No carotid bruit or JVD. No abdominal aortic bruit.  Gastrointestinal: Nontender, no masses. No hepatomegaly, no splenomegaly. No hernia appreciated. Bowel sounds normal. Rectal exam deferred.    Musculoskeletal: Gait normal. No clubbing/cyanosis of digits.   Neurological: Normal balance/coordination. No tremor.   Skin: warm, dry, intact. No rash/ulcer. No concerning nevi or subq nodules on limited exam.    Psychiatric: Normal judgment/insight. Normal mood and affect. Oriented x3.     ASSESSMENT/PLAN:   Annual physical exam - Plan: CBC with Differential/Platelet, COMPLETE METABOLIC PANEL WITH GFR, Lipid panel  Hypothyroidism, unspecified type - Plan: levothyroxine (SYNTHROID, LEVOTHROID) 100 MCG tablet, TSH, VITAMIN D 25 Hydroxy (Vit-D Deficiency, Fractures)  Postmenopausal - Plan: VITAMIN D 25 Hydroxy (Vit-D Deficiency, Fractures)  Need for vaccination with 13-polyvalent pneumococcal conjugate vaccine - Plan: Pneumococcal conjugate vaccine 13-valent  Genital herpes simplex, unspecified site - Plan: valACYclovir (VALTREX) 500 MG tablet   FEMALE PREVENTIVE CARE Updated 11/25/16   ANNUAL SCREENING/COUNSELING  Diet/Exercise - HEALTHY HABITS DISCUSSED TO DECREASE CV RISK History  Smoking Status  . Never Smoker  Smokeless Tobacco  . Never Used   History  Alcohol use Not on file   Depression screen [facility] 2/9 11/25/2016  Decreased Interest 0  Down, Depressed, Hopeless 0  PHQ - 2 Score 0    Domestic violence concerns - no  HTN SCREENING - SEE VITALS  SEXUAL HEALTH  Sexually active in the past year - Yes with female.  Need/want STI testing today? - no  Concerns about libido or pain with sex? - no  Plans for pregnancy? - postmenopause   INFECTIOUS DISEASE SCREENING  HIV - does not need  GC/CT - does not need  HepC - DOB [birth year] - does not need  TB - does not need  DISEASE SCREENING  Lipid - does not need  DM2 - does not need  Osteoporosis - women age 60+ - does not need - records [facility]  CANCER SCREENING  Cervical - does not need  Breast - does not need  Lung - does not need  Colon - does not need - Cologuard will be due in  2020  ADULT VACCINATION  Influenza - annual vaccine recommended  Td - booster every 10 years   Zoster - option at 50, yes at 60+   PCV13 - was given  PPSV23 - already has Immunization History  Administered Date(s) Administered  . Influenza-Unspecified 07/22/2015, 08/07/2016  . Pneumococcal Conjugate-13 11/25/2016  . Pneumococcal Polysaccharide-23 10/26/2013, 11/01/2014  . Td 08/30/2005  . Tdap 04/04/2010  . Zoster 11/16/2015     OTHER  Fall - exercise and Vit D age 60+ - needs   Patient Instructions  Plan:  Need records from OBGYN - next time you're there, just have them fax [place]!  Prevnar shot today, then you're all set for pneumonia vaccination  You should hear back about your lab results in a few days  Refills should be at your pharmacy  Take care! -Dr. Mervyn Skeeters.      Visit summary with medication list and pertinent instructions was printed for patient to review. All questions at time of visit were answered - patient instructed to contact office with any additional concerns. ER/RTC precautions were reviewed with the patient. Follow-up plan: Return in about 1 year (around 11/25/2017) for  annual physical, sooner if needed.

## 2016-11-25 NOTE — Patient Instructions (Signed)
Plan:  Need records from OBGYN - next time you're there, just have them fax us!  Prevnar shot today, then you're all set for pneumonia vaccination  You should hear back about your lab results in a few days  Refills should be at your pharmacy  Take care! -Dr. Mervyn SkeetersA.    Thank you for coming in today. You should receive an email asking you to complete a brief survey regarding your experience today at [facility]. Please take a moment to respond to this survey. Our goal is to serve you! Constructive criticism helps us to improve, and positive feedback helps our practice to shine, plus it makes us smile! Thanks in advance for your feedback.

## 2016-11-26 LAB — VITAMIN D 25 HYDROXY (VIT D DEFICIENCY, FRACTURES): Vit D, 25-Hydroxy: 27 ng/mL — ABNORMAL LOW (ref 30–100)

## 2016-12-16 ENCOUNTER — Other Ambulatory Visit: Payer: Self-pay | Admitting: Osteopathic Medicine

## 2016-12-16 DIAGNOSIS — A6000 Herpesviral infection of urogenital system, unspecified: Secondary | ICD-10-CM

## 2017-01-20 ENCOUNTER — Telehealth: Payer: Self-pay

## 2017-01-20 DIAGNOSIS — A6000 Herpesviral infection of urogenital system, unspecified: Secondary | ICD-10-CM

## 2017-01-20 MED ORDER — VALACYCLOVIR HCL 500 MG PO TABS: 500.0000 mg | ORAL_TABLET | Freq: Every day | ORAL | 3 refills | Status: DC

## 2017-01-20 NOTE — Telephone Encounter (Signed)
Hailey Parrish states she takes the Valtrex 500 mg once daily. She would like a new script with refills. Enough to get her to her next appointment. Please advise.

## 2017-01-20 NOTE — Telephone Encounter (Signed)
Patient advised.

## 2017-01-20 NOTE — Telephone Encounter (Signed)
Rx sent 

## 2017-03-04 ENCOUNTER — Other Ambulatory Visit: Payer: Self-pay | Admitting: Osteopathic Medicine

## 2017-03-04 DIAGNOSIS — Z1231 Encounter for screening mammogram for malignant neoplasm of breast: Secondary | ICD-10-CM

## 2017-03-12 ENCOUNTER — Ambulatory Visit: Payer: Medicare Other

## 2017-03-18 ENCOUNTER — Ambulatory Visit (INDEPENDENT_AMBULATORY_CARE_PROVIDER_SITE_OTHER): Payer: Medicare Other

## 2017-03-18 DIAGNOSIS — Z1231 Encounter for screening mammogram for malignant neoplasm of breast: Secondary | ICD-10-CM

## 2017-04-10 ENCOUNTER — Ambulatory Visit (INDEPENDENT_AMBULATORY_CARE_PROVIDER_SITE_OTHER): Payer: Medicare Other | Admitting: Osteopathic Medicine

## 2017-04-10 VITALS — BP 131/63 | HR 75 | Ht 63.0 in | Wt 156.1 lb

## 2017-04-10 DIAGNOSIS — L259 Unspecified contact dermatitis, unspecified cause: Secondary | ICD-10-CM | POA: Diagnosis not present

## 2017-04-10 MED ORDER — BETAMETHASONE DIPROPIONATE 0.05 % EX CREA: TOPICAL_CREAM | Freq: Two times a day (BID) | CUTANEOUS | 0 refills | Status: DC

## 2017-04-10 MED ORDER — PREDNISONE 10 MG (48) PO TBPK: ORAL_TABLET | Freq: Every day | ORAL | 0 refills | Status: DC

## 2017-04-10 NOTE — Patient Instructions (Addendum)
Plan:  Topical steroids to affected area up to 2 weeks  If no better after 1-2 days on the topical therapy, fill oral steroids  If worse, call!  Can also take Benadryl 25 mg 1-2 tablets every 4-6 hours as directed for itching or to help through the night (MAX 300 mg or 12 tablets per day)

## 2017-04-10 NOTE — Progress Notes (Signed)
HPI: Hailey Parrish is a 71 y.o. female  who [facility] today, 04/10/17,  for chief complaint of:  Chief Complaint  Patient presents with  . Rash     . Context: no new exposures known . Location: back of both legs . Quality: itching, burning . Duration: 2-3 days  Modifying factors: tried Benadryl, not too helpful  . Assoc signs/symptoms: no fever or joint pain, no headache   Past medical history, surgical history, social history and family history reviewed.  Patient Active Problem List   Diagnosis Date Noted  . 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 Cardiac risk counseling 08/17/2015  . Foot pain, right 08/17/2015  . Hypothyroidism 08/16/2015  . History of appendectomy 08/16/2015  . Family history of MI (myocardial infarction) 08/16/2015  . Postmenopausal 08/16/2015  . Chronic low back pain 08/16/2015  . Leg pain, lateral 08/16/2015    Current medication list and allergy/intolerance information reviewed.   Current Outpatient Prescriptions on File Prior to Visit  Medication Sig Dispense Refill  . levothyroxine (SYNTHROID, LEVOTHROID) 100 MCG tablet Take 1 tablet (100 mcg total) by mouth daily. 90 tablet 1  . meloxicam (MOBIC) 7.5 MG tablet Take 1 tablet (7.5 mg total) by mouth daily. (Patient taking differently: Take 7.5 mg by mouth as needed. ) 30 tablet 3  . valACYclovir (VALTREX) 500 MG tablet Take 1 tablet (500 mg total) by mouth daily. 90 tablet 3  . zoster vaccine live, PF, (ZOSTAVAX) [id number] UNT/0.65ML injection Inject 19,400 Units into the skin once. 1 each 0   No current facility-administered medications on file prior to visit.    No Known Allergies    Review of Systems:  Constitutional: No recent illness  HEENT: No  headache  Cardiac: No  chest pain  Musculoskeletal: No new myalgia/arthralgia  Skin: +Rash  Neurologic: No  weakness, No  Dizziness   Exam:  BP 131/63   Pulse 75   Ht 5\' 3"  (1.6 m)   Wt 156 lb 1.6 oz  (70.8 kg)   SpO2 99%   BMI 27.65 kg/m   Constitutional: VS see above. General Appearance: alert, well-developed, well-nourished, NAD   Respiratory: Normal respiratory effort.   Musculoskeletal: Gait normal. Symmetric and independent movement of all extremities  Neurological: Normal balance/coordination. No tremor.  Skin: warm, dry, intact. (+)erythema c/w ?contact posterior legs covered by shorts - see below photo       ASSESSMENT/PLAN:    Contact dermatitis, unspecified contact dermatitis type, unspecified trigger    Patient Instructions  Plan:  Topical steroids to affected area up to 2 weeks  If no better after 1-2 days on the topical therapy, fill oral steroids  If worse, call!  Can also take Benadryl 25 mg 1-2 tablets every 4-6 hours as directed for itching or to help through the night (MAX 300 mg or 12 tablets per day)  Outpatient Encounter Prescriptions as of 04/10/2017  Medication Sig  . levothyroxine (SYNTHROID, LEVOTHROID) 100 MCG tablet Take 1 tablet (100 mcg total) by mouth daily.  . valACYclovir (VALTREX) 500 MG tablet Take 1 tablet (500 mg total) by mouth daily.  Marland Kitchen. zoster vaccine live, PF, (ZOSTAVAX) [id number] UNT/0.65ML injection Inject 19,400 Units into the skin once.  . betamethasone dipropionate (DIPROLENE) 0.05 % cream Apply topically 2 (two) times daily. To affected area(s) as needed for itching - no more than 2 weeks  . meloxicam (MOBIC) 7.5 MG tablet Take 1 tablet (7.5 mg total) by mouth daily. (Patient not  taking: Reported on 04/10/2017)  . predniSONE (STERAPRED UNI-PAK 48 TAB) 10 MG (48) TBPK tablet Take by mouth daily. 12-Day taper, po. Fill Rx in case topical therapy not helping   No facility-administered encounter medications on file as of 04/10/2017.       Follow-up plan: Return if rash symptoms worsen or fail to improve.  Visit summary with medication list and pertinent instructions was printed for patient to review, alert [place] if any changes  needed. All questions at time of visit were answered - patient instructed to contact office with any additional concerns. ER/RTC precautions were reviewed with the patient and understanding verbalized.

## 2017-06-24 ENCOUNTER — Other Ambulatory Visit: Payer: Self-pay | Admitting: Osteopathic Medicine

## 2017-06-24 DIAGNOSIS — E039 Hypothyroidism, unspecified: Secondary | ICD-10-CM

## 2017-11-26 ENCOUNTER — Encounter: Payer: Self-pay | Admitting: Osteopathic Medicine

## 2017-11-26 ENCOUNTER — Ambulatory Visit (INDEPENDENT_AMBULATORY_CARE_PROVIDER_SITE_OTHER): Payer: Medicare Other | Admitting: Osteopathic Medicine

## 2017-11-26 VITALS — BP 118/73 | HR 60 | Temp 97.5°F | Wt 156.1 lb

## 2017-11-26 DIAGNOSIS — E559 Vitamin D deficiency, unspecified: Secondary | ICD-10-CM | POA: Diagnosis not present

## 2017-11-26 DIAGNOSIS — E039 Hypothyroidism, unspecified: Secondary | ICD-10-CM | POA: Diagnosis not present

## 2017-11-26 DIAGNOSIS — Z1211 Encounter for screening for malignant neoplasm of colon: Secondary | ICD-10-CM | POA: Diagnosis not present

## 2017-11-26 DIAGNOSIS — A6000 Herpesviral infection of urogenital system, unspecified: Secondary | ICD-10-CM

## 2017-11-26 DIAGNOSIS — Z0000 Encounter for general adult medical examination without abnormal findings: Secondary | ICD-10-CM | POA: Diagnosis not present

## 2017-11-26 MED ORDER — ZOSTER VAC RECOMB ADJUVANTED 50 MCG/0.5ML IM SUSR: 0.5000 mL | Freq: Once | INTRAMUSCULAR | 1 refills | Status: AC

## 2017-11-26 MED ORDER — VALACYCLOVIR HCL 500 MG PO TABS: 500.0000 mg | ORAL_TABLET | Freq: Every day | ORAL | 3 refills | Status: DC

## 2017-11-26 NOTE — Progress Notes (Signed)
Subjective:   Hailey Parrish is a 72 y.o. female who presents for Medicare Annual (Subsequent) preventive examination.  Hot flashes bothering her on occasion. HRT in the past, mood issues on and off but husband also recently diagnosed w/ non-hodgkins lymphoma which obviously is causing some stress.   Review of Systems:  12 point ROS negative except as noted in HPI  Depression screening negative  ADL's able to perform all Feels safe at home            Objective:     Vitals: BP 118/73   Pulse 60   Temp (!) 97.5 F (36.4 C) (Oral)   Wt 156 lb 1.9 oz (70.8 kg)   BMI 27.66 kg/m   Body mass index is 27.66 kg/m.  Advanced Directives 08/21/2015  Does Patient Have a Medical Advance Directive? Yes  Copy of Healthcare Power of Attorney in Chart? Yes    Tobacco Social History   Tobacco Use  Smoking Status Never Smoker  Smokeless Tobacco Never Used     Counseling given: Not Answered   Clinical Intake: no concerns   LABS:  Last routine labs about a year ago: Low vitamin D Normal TSH LDL slightly elevated but HDL looked good  CBC and CMP good  Past Medical History:  Diagnosis Date  . Cardiac risk counseling 08/17/2015   ASCVD 10 year risk 6.7% calculated 08/2015  . Family history of MI (myocardial infarction) 08/16/2015   father  . Hypothyroidism 08/16/2015  . Postmenopausal 08/16/2015   Past Surgical History:  Procedure Laterality Date  . APPENDECTOMY  1963  . REVISION OF SCAR TISSUE RECTUS MUSCLE  2011   Family History  Problem Relation Age of Onset  . Hyperlipidemia Mother   . Cancer Father        lung  . Heart attack Father    Social History   Socioeconomic History  . Marital status: Married    Spouse name: None  . Number of children: None  . Years of education: None  . Highest education level: None  Social Needs  . Financial resource strain: None  . Food insecurity - worry: None  . Food insecurity - inability: None  . Transportation needs -  medical: None  . Transportation needs - non-medical: None  Occupational History  . None  Tobacco Use  . Smoking status: Never Smoker  . Smokeless tobacco: Never Used  Substance and Sexual Activity  . Alcohol use: Yes    Alcohol/week: 0.0 oz    Comment: 4 per week, moderate   . Drug use: No  . Sexual activity: Yes  Other Topics Concern  . None  Social History Narrative  . None    Outpatient Encounter Medications as of 11/26/2017  Medication Sig  . levothyroxine (SYNTHROID, LEVOTHROID) 100 MCG tablet TAKE ONE TABLET BY MOUTH EVERY DAY  . valACYclovir (VALTREX) 500 MG tablet Take 1 tablet (500 mg total) by mouth daily.  Marland Kitchen zoster vaccine live, PF, (ZOSTAVAX) [id number] UNT/0.65ML injection Inject 19,400 Units into the skin once.  . betamethasone dipropionate (DIPROLENE) 0.05 % cream Apply topically 2 (two) times daily. To affected area(s) as needed for itching - no more than 2 weeks (Patient not taking: Reported on 11/26/2017)  . meloxicam (MOBIC) 7.5 MG tablet Take 1 tablet (7.5 mg total) by mouth daily. (Patient not taking: Reported on 04/10/2017)  . predniSONE (STERAPRED UNI-PAK 48 TAB) 10 MG (48) TBPK tablet Take by mouth daily. 12-Day taper, po. Fill Rx in case topical  therapy not helping (Patient not taking: Reported on 11/26/2017)   No facility-administered encounter medications on file as of 11/26/2017.     Activities of Daily Living No limitations   Patient Care Team: Sunnie Nielsen, DO as PCP - General (Osteopathic Medicine)  Leatrice Jewels for well-woman care     Assessment:   This is a routine wellness examination for Hailey Parrish.  Exercise Activities and Dietary recommendations    Goals    None      Fall Risk Fall Risk  11/25/2016 11/25/2016 11/16/2015  Falls in the past year? No No No   Is the patient's home free of loose throw rugs in walkways, pet beds, electrical cords, etc?   yes      Grab bars in the bathroom? no      Handrails on the stairs?   yes      Adequate  lighting?   yes   Has advance directive but no copy on file.   Timed Get Up and Go performed: normal   Depression Screen PHQ 2/9 Scores 11/26/2017 11/25/2016 11/25/2016 11/16/2015  PHQ - 2 Score 0 0 0 0     Cognitive Function: normal         Immunization History  Administered Date(s) Administered  . Influenza-Unspecified 07/22/2015, 08/07/2016, 05/07/2017  . Pneumococcal Conjugate-13 11/25/2016  . Pneumococcal Polysaccharide-23 10/26/2013, 11/01/2014  . Td 08/30/2005  . Tdap 04/04/2010  . Zoster 11/16/2015    Qualifies for Shingles Vaccine?yes  Screening Tests Health Maintenance  Topic Date Due  . INFLUENZA VACCINE  05/07/2017  . COLONOSCOPY  10/07/2017  . MAMMOGRAM  03/19/2019  . TETANUS/TDAP  04/04/2020  . DEXA SCAN  Completed  . Hepatitis C Screening  Completed  . PNA vac Low Risk Adult  Completed     Cancer Screenings: Lung: Low Dose CT Chest recommended if Age 22-80 years, 30 pack-year currently smoking OR have quit w/in 15years. Patient does not qualify. Breast:  Up to date on Mammogram? Yes   Up to date of Bone Density/Dexa? No Colorectal: needs   Additional Screenings: Hepatitis B/HIV/Syphillis: not needed  Hepatitis C Screening: done 2017     Plan:    Medicare annual wellness visit, subsequent  Vitamin D deficiency - Plan: VITAMIN D 25 Hydroxy (Vit-D Deficiency, Fractures)  Hypothyroidism, unspecified type - Plan: CBC, COMPLETE METABOLIC PANEL WITH GFR, Lipid panel, TSH  Annual physical exam - Plan: CBC, COMPLETE METABOLIC PANEL WITH GFR, Lipid panel, TSH, VITAMIN D 25 Hydroxy (Vit-D Deficiency, Fractures)  Genital herpes simplex, unspecified site - Plan: valACYclovir (VALTREX) 500 MG tablet  Colon cancer screening - Plan: Cologuard   I have personally reviewed and noted the following in the patient's chart:   . Medical and social history . Use of alcohol, tobacco or illicit drugs  . Current medications and supplements . Functional ability  and status . Nutritional status . Physical activity . Advanced directives . List of other physicians . Hospitalizations, surgeries, and ER visits in previous 12 months . Vitals . Screenings to include cognitive, depression, and falls . Referrals and appointments  In addition, I have reviewed and discussed with patient certain preventive protocols, quality metrics, and best practice recommendations. A written personalized care plan for preventive services as well as general preventive health recommendations were provided to patient.     Patient Instructions  Medication for mood/hot flashes: Effexor  Let me know if you'd like to try this or get back on hormones  Sunnie NielsenNatalie Ora Bollig, DO  11/26/2017

## 2017-11-26 NOTE — Patient Instructions (Signed)
Medication for mood/hot flashes: Effexor  Let me know if you'd like to try this or get back on hormones

## 2017-11-27 LAB — CBC
HEMATOCRIT: 42.5 % (ref 35.0–45.0)
HEMOGLOBIN: 14.4 g/dL (ref 11.7–15.5)
MCH: 30.1 pg (ref 27.0–33.0)
MCHC: 33.9 g/dL (ref 32.0–36.0)
MCV: 88.9 fL (ref 80.0–100.0)
MPV: 10.2 fL (ref 7.5–12.5)
Platelets: 298 10*3/uL (ref 140–400)
RBC: 4.78 10*6/uL (ref 3.80–5.10)
RDW: 12.1 % (ref 11.0–15.0)
WBC: 4.8 10*3/uL (ref 3.8–10.8)

## 2017-11-27 LAB — COMPLETE METABOLIC PANEL WITH GFR
AG Ratio: 1.7 (calc) (ref 1.0–2.5)
ALBUMIN MSPROF: 4.5 g/dL (ref 3.6–5.1)
ALKALINE PHOSPHATASE (APISO): 71 U/L (ref 33–130)
ALT: 15 U/L (ref 6–29)
AST: 17 U/L (ref 10–35)
BUN: 16 mg/dL (ref 7–25)
CO2: 28 mmol/L (ref 20–32)
CREATININE: 0.9 mg/dL (ref 0.50–0.99)
Calcium: 9.7 mg/dL (ref 8.6–10.4)
Chloride: 106 mmol/L (ref 98–110)
GFR, EST AFRICAN AMERICAN: 76 mL/min/{1.73_m2} (ref >=60)
GFR, Est Non African American: 65 mL/min/{1.73_m2} (ref >=60)
GLOBULIN: 2.6 g/dL (ref 1.9–3.7)
Glucose, Bld: 86 mg/dL (ref 65–99)
Potassium: 4.4 mmol/L (ref 3.5–5.3)
SODIUM: 142 mmol/L (ref 135–146)
Total Bilirubin: 0.8 mg/dL (ref 0.2–1.2)
Total Protein: 7.1 g/dL (ref 6.1–8.1)

## 2017-11-27 LAB — LIPID PANEL
Cholesterol: 215 mg/dL — ABNORMAL HIGH (ref <200)
HDL: 52 mg/dL (ref >50)
LDL Cholesterol (Calc): 144 mg/dL (calc) — ABNORMAL HIGH
Non-HDL Cholesterol (Calc): 163 mg/dL (calc) — ABNORMAL HIGH (ref <130)
TRIGLYCERIDES: 87 mg/dL (ref <150)
Total CHOL/HDL Ratio: 4.1 (calc) (ref <5.0)

## 2017-11-27 LAB — TSH: TSH: 0.51 mIU/L (ref 0.40–4.50)

## 2017-11-27 LAB — VITAMIN D 25 HYDROXY (VIT D DEFICIENCY, FRACTURES): VIT D 25 HYDROXY: 29 ng/mL — AB (ref 30–100)

## 2017-11-28 ENCOUNTER — Other Ambulatory Visit: Payer: Self-pay | Admitting: Osteopathic Medicine

## 2017-11-28 DIAGNOSIS — E039 Hypothyroidism, unspecified: Secondary | ICD-10-CM

## 2017-11-28 MED ORDER — LEVOTHYROXINE SODIUM 100 MCG PO TABS: 100.0000 ug | ORAL_TABLET | Freq: Every day | ORAL | 3 refills | Status: DC

## 2017-11-28 NOTE — Progress Notes (Signed)
Refill thyroid meds  

## 2017-12-10 LAB — COLOGUARD

## 2018-03-18 ENCOUNTER — Encounter: Payer: Self-pay | Admitting: Osteopathic Medicine

## 2018-03-18 ENCOUNTER — Ambulatory Visit (INDEPENDENT_AMBULATORY_CARE_PROVIDER_SITE_OTHER): Payer: Medicare Other | Admitting: Osteopathic Medicine

## 2018-03-18 VITALS — BP 128/70 | HR 52 | Temp 97.8°F | Wt 160.2 lb

## 2018-03-18 DIAGNOSIS — J309 Allergic rhinitis, unspecified: Secondary | ICD-10-CM

## 2018-03-18 DIAGNOSIS — F5101 Primary insomnia: Secondary | ICD-10-CM | POA: Diagnosis not present

## 2018-03-18 MED ORDER — TRAZODONE HCL 50 MG PO TABS: 25.0000 mg | ORAL_TABLET | Freq: Every evening | ORAL | 1 refills | Status: DC | PRN

## 2018-03-18 NOTE — Progress Notes (Signed)
HPI: Hailey Parrish is a 72 y.o. female who  has a past medical history of Cardiac risk counseling (08/17/2015), Family history of MI (myocardial infarction) (08/16/2015), Hypothyroidism (08/16/2015), and Postmenopausal (08/16/2015).  she presents to [facility] today, 03/18/18,  for chief complaint of:  "Discuss physical issues, sore throat"   Having some hoaseness for a few weeks along with sore throat, maybe 3 weeks total. Ears feel aching, not pain. OTC meds not helping. Feels better today but not totally resolved.   Insomnia, getting up in the middle of the night, or trouble sleeping w/ onset issues.    Past medical history, surgical history, and family history reviewed.  Current medication list and allergy/intolerance information reviewed.   (See remainder of HPI, ROS, Phys Exam below)  BP 128/70 (BP Location: Left Arm, Patient Position: Sitting, Cuff Size: Normal)   Pulse (!) 52   Temp 97.8 F (36.6 C) (Oral)   Wt 160 lb 3.2 oz (72.7 kg)   BMI 28.38 kg/m    ASSESSMENT/PLAN:   Allergic rhinitis, unspecified seasonality, unspecified trigger - doubt bacterial complication, sx actually resolveing, OTC meds advised see pt instructions   Primary insomnia - sleep hygiene discussed as well as melatonin few hrs prior to bedtime, trazodone prn   Meds ordered this encounter  Medications  . traZODone (DESYREL) 50 MG tablet    Sig: Take 0.5-1 tablets (25-50 mg total) by mouth at bedtime as needed for sleep.    Dispense:  30 tablet    Refill:  1    Patient Instructions  Even fairly severe allergies can typically be treated successfully with over-the-counter medications. I usually will recommend a combination of: . steroid nasal spray such as Flonase or Nasonex or one of their generic equivalents, PLUS... . an antihistamine such as Allegra (tends to be  Zyrtec, or Claritin or one of their generic equivalents.  . Can also add a decongestant, either  Sudafed on its own OR any of the antihistamines with the "-D" in the name that he has to show your ID to the pharmacist to buy.  But this might make you feel a bit hyper!  If the combination isn't helping after about 2 weeks, let me know and I can send in a prescription antihistamine nasal spray to use with the oral medications as well. The above medicines will work for most people, though.    Can try the trazodone as needed for sleep    In a few weeks, call me and let me know how this is all working for you!     Follow-up plan: Return if symptoms worsen or fail to improve.     ############################################ ############################################ ############################################ ############################################    Outpatient Encounter Medications as of 03/18/2018  Medication Sig  . levothyroxine (SYNTHROID, LEVOTHROID) 100 MCG tablet Take 1 tablet (100 mcg total) by mouth daily.  . valACYclovir (VALTREX) 500 MG tablet Take 1 tablet (500 mg total) by mouth daily.  . [DISCONTINUED] zoster vaccine live, PF, (ZOSTAVAX) [id number] UNT/0.65ML injection Inject 19,400 Units into the skin once.  . [DISCONTINUED] betamethasone dipropionate (DIPROLENE) 0.05 % cream Apply topically 2 (two) times daily. To affected area(s) as needed for itching - no more than 2 weeks (Patient not taking: Reported on 03/18/2018)  . [DISCONTINUED] meloxicam (MOBIC) 7.5 MG tablet Take 1 tablet (7.5 mg total) by mouth daily. (Patient not taking: Reported on 03/18/2018)   No facility-administered encounter medications on file as of 03/18/2018.    No Known  Allergies    Review of Systems:  Constitutional: No recent illness  HEENT: +sinus headache, no vision change - see above   Cardiac: No  chest pain, No  pressure, No palpitations  Respiratory:  No  shortness of breath. No  Cough  Musculoskeletal: No new myalgia/arthralgia  Skin: No  Rash  Hem/Onc: No  easy  bruising/bleeding, No  abnormal lumps/bumps  Neurologic: No  weakness, No  Dizziness  Psychiatric: No  concerns with depression, No  concerns with anxiety, No insomnia   Exam:  BP 128/70 (BP Location: Left Arm, Patient Position: Sitting, Cuff Size: Normal)   Pulse (!) 52   Temp 97.8 F (36.6 C) (Oral)   Wt 160 lb 3.2 oz (72.7 kg)   BMI 28.38 kg/m   Constitutional: VS see above. General Appearance: alert, well-developed, well-nourished, NAD  Eyes: Normal lids and conjunctive, non-icteric sclera  Ears, Nose, Mouth, Throat: MMM, Normal external inspection ears/nares/mouth/lips/gums. TM normal bilaterally, pale nasal mucosa w/ increased congestion L side   Neck: No masses, trachea midline. No lymphadenopathy   Respiratory: Normal respiratory effort. no wheeze, no rhonchi, no rales  Cardiovascular: S1/S2 normal, no murmur, no rub/gallop auscultated. RRR.   Musculoskeletal: Gait normal. Symmetric and independent movement of all extremities  Neurological: Normal balance/coordination. No tremor.  Skin: warm, dry, intact.   Psychiatric: Normal judgment/insight. Normal mood and affect. Oriented x3.   Visit summary with medication list and pertinent instructions was printed for patient to review, advised to alert us if any changes needed. All questions at time of visit were answered - patient instructed to contact office with any additional concerns. ER/RTC precautions were reviewed with the patient and understanding verbalized.   Follow-up plan: Return if symptoms worsen or fail to improve.  Note: Total time spent 25 minutes, greater than 50% of the visit was spent face-to-face counseling and coordinating care for the following: The primary encounter diagnosis was Allergic rhinitis, unspecified seasonality, unspecified trigger. A diagnosis of Primary insomnia was also pertinent to this visit.Marland Kitchen.  Please note: voice recognition software was used to produce this document, and typos may  escape review. Please contact Dr. Lyn HollingsheadAlexander for any needed clarifications.

## 2018-03-18 NOTE — Patient Instructions (Addendum)
Even fairly severe allergies can typically be treated successfully with over-the-counter medications. I usually will recommend a combination of: . steroid nasal spray such as Flonase or Nasonex or one of their generic equivalents, PLUS... . an antihistamine such as Allegra (tends to be  Zyrtec, or Claritin or one of their generic equivalents.  . Can also add a decongestant, either Sudafed on its own OR any of the antihistamines with the "-D" in the name that he has to show your ID to the pharmacist to buy.  But this might make you feel a bit hyper!  If the combination isn't helping after about 2 weeks, let me know and I can send in a prescription antihistamine nasal spray to use with the oral medications as well. The above medicines will work for most people, though.    Can try the trazodone as needed for sleep    In a few weeks, call me and let me know how this is all working for you!

## 2018-04-20 ENCOUNTER — Other Ambulatory Visit: Payer: Self-pay | Admitting: Osteopathic Medicine

## 2018-04-20 DIAGNOSIS — E7800 Pure hypercholesterolemia, unspecified: Secondary | ICD-10-CM

## 2018-04-20 NOTE — Progress Notes (Signed)
Lipid order placed for lab recheck prior to next appt with PCP per Pt request. OK to order based on last result note.

## 2018-04-28 ENCOUNTER — Other Ambulatory Visit: Payer: Self-pay | Admitting: Osteopathic Medicine

## 2018-04-28 DIAGNOSIS — Z1231 Encounter for screening mammogram for malignant neoplasm of breast: Secondary | ICD-10-CM

## 2018-04-29 ENCOUNTER — Ambulatory Visit (INDEPENDENT_AMBULATORY_CARE_PROVIDER_SITE_OTHER): Payer: Medicare Other

## 2018-04-29 DIAGNOSIS — Z1231 Encounter for screening mammogram for malignant neoplasm of breast: Secondary | ICD-10-CM | POA: Diagnosis not present

## 2018-05-27 ENCOUNTER — Ambulatory Visit: Payer: Medicare Other | Admitting: Osteopathic Medicine

## 2018-06-01 LAB — LIPID PANEL
CHOL/HDL RATIO: 3.8 (calc) (ref <5.0)
Cholesterol: 241 mg/dL — ABNORMAL HIGH (ref <200)
HDL: 63 mg/dL (ref >50)
LDL CHOLESTEROL (CALC): 160 mg/dL — AB
Non-HDL Cholesterol (Calc): 178 mg/dL (calc) — ABNORMAL HIGH (ref <130)
Triglycerides: 81 mg/dL (ref <150)

## 2018-06-02 ENCOUNTER — Encounter: Payer: Self-pay | Admitting: Osteopathic Medicine

## 2018-06-02 ENCOUNTER — Ambulatory Visit (INDEPENDENT_AMBULATORY_CARE_PROVIDER_SITE_OTHER): Payer: Medicare Other | Admitting: Osteopathic Medicine

## 2018-06-02 VITALS — BP 124/64 | HR 57 | Temp 97.7°F | Wt 158.8 lb

## 2018-06-02 DIAGNOSIS — E785 Hyperlipidemia, unspecified: Secondary | ICD-10-CM

## 2018-06-02 NOTE — Progress Notes (Signed)
HPI: Hailey Parrish is a 72 y.o. female who  has a past medical history of Cardiac risk counseling (08/17/2015), Family history of MI (myocardial infarction) (08/16/2015), Hypothyroidism (08/16/2015), and Postmenopausal (08/16/2015).  she presents to [facility] today, 06/02/18,  for chief complaint of:  Cholesterol concern  Insomnia  Cholesterol (LDL and TC) still on the high side. ASCVD risk 10-year >7.5%. She'd like to avoid medicines if possible.   Trazodone caused some problems for her, she is ok to stay off medicines for insomnia     Past medical history, surgical history, and family history reviewed.  Current medication list and allergy/intolerance information reviewed.   (See remainder of HPI, ROS, Phys Exam below)    Results for orders placed or performed in visit on 04/20/18 (from the past 72 hour(s))  Lipid Profile     Status: Abnormal   Collection Time: 06/01/18  8:14 AM  Result Value Ref Range   Cholesterol 241 (H) <200 mg/dL   HDL 63 >16 mg/dL   Triglycerides 81 <109 mg/dL   LDL Cholesterol (Calc) 160 (H) mg/dL (calc)    Comment: Reference range: <100 . Desirable range <100 mg/dL for primary prevention;   <70 mg/dL for patients with CHD or diabetic patients  with > or = 2 CHD risk factors. Marland Kitchen LDL-C is now calculated using the Martin-Hopkins  calculation, which is a validated novel method providing  better accuracy than the Friedewald equation in the  estimation of LDL-C.  Horald Pollen et al. Lenox Ahr. 6045;409(81): 2061-2068  (http://education.[facility].com/faq/FAQ164)    Total CHOL/HDL Ratio 3.8 <5.0 (calc)   Non-HDL Cholesterol (Calc) 178 (H) <130 mg/dL (calc)    Comment: For patients with diabetes plus 1 major ASCVD risk  factor, treating to a non-HDL-C goal of <100 mg/dL  (LDL-C of <19 mg/dL) is considered a therapeutic  option.      ASSESSMENT/PLAN:   Hyperlipidemia, unspecified hyperlipidemia type   Shared  decision-making - no statin for now Pt inquired about coronary calcium scores and if this test might benefit her, I'll look into this, have set myself a reminder Will be traveling w/ husband in their boat for a few months, will see her in the spring!    Follow-up plan: Return for annual physical when back from [place], see me sooner if needed .     ############################################ ############################################ ############################################ ############################################    Outpatient Encounter Medications as of 06/02/2018  Medication Sig  . levothyroxine (SYNTHROID, LEVOTHROID) 100 MCG tablet Take 1 tablet (100 mcg total) by mouth daily.  . traZODone (DESYREL) 50 MG tablet Take 0.5-1 tablets (25-50 mg total) by mouth at bedtime as needed for sleep.  . valACYclovir (VALTREX) 500 MG tablet Take 1 tablet (500 mg total) by mouth daily.   No facility-administered encounter medications on file as of 06/02/2018.    No Known Allergies    Review of Systems:  Constitutional: No recent illness  Cardiac: No  chest pain, No  pressure, No palpitations  Respiratory:  No  shortness of breath. No  Cough  Neurologic: No  weakness, No  Dizziness   Exam:  BP 124/64 (BP Location: Left Arm, Patient Position: Sitting, Cuff Size: Normal)   Pulse (!) 57   Temp 97.7 F (36.5 C) (Oral)   Wt 158 lb 12.8 oz (72 kg)   BMI 28.13 kg/m   Constitutional: VS see above. General Appearance: alert, well-developed, well-nourished, NAD  Eyes: Normal lids and conjunctive, non-icteric sclera  Ears, Nose, Mouth, Throat:  MMM, Normal external inspection ears/nares/mouth/lips/gums.  Neck: No masses, trachea midline.   Respiratory: Normal respiratory effort.   Musculoskeletal: Gait normal. Symmetric and independent movement of all extremities  Neurological: Normal balance/coordination. No tremor.  Skin: warm, dry, intact.   Psychiatric: Normal  judgment/insight. Normal mood and affect. Oriented x3.   Visit summary with medication list and pertinent instructions was printed for patient to review, advised to alert us if any changes needed. All questions at time of visit were answered - patient instructed to contact office with any additional concerns. ER/RTC precautions were reviewed with the patient and understanding verbalized.   Follow-up plan: Return for annual physical when back from [place], see me sooner if needed .  Note: Total time spent 15 minutes, greater than 50% of the visit was spent face-to-face counseling and coordinating care for the following: The encounter diagnosis was Hyperlipidemia, unspecified hyperlipidemia type..  Please note: voice recognition software was used to produce this document, and typos may escape review. Please contact Dr. Lyn HollingsheadAlexander for any needed clarifications.

## 2018-06-05 ENCOUNTER — Telehealth: Payer: Self-pay | Admitting: Osteopathic Medicine

## 2018-06-05 NOTE — Telephone Encounter (Signed)
Please call patient: She had a question about coronary artery calcium score testing.  I looked into this a little bit.  For patients without symptoms, and who are at otherwise fairly low risk for heart problems, the [employer] recommends against screening with CT scan.  I could certainly send her to a cardiologist to have a consultation about her risk factors and whether she may benefit from screening anyway, but I just don't have a great answer for her on this question.   Please let me know if she would like me to put in a referral.

## 2018-06-05 NOTE — Telephone Encounter (Signed)
Okay, can address in detail at her next visit if needed.

## 2018-06-05 NOTE — Telephone Encounter (Signed)
Pt has been updated of provider's note and declines referral for cardiologist since risk factor is low.

## 2018-08-28 ENCOUNTER — Telehealth: Payer: Self-pay

## 2018-08-28 DIAGNOSIS — A6000 Herpesviral infection of urogenital system, unspecified: Secondary | ICD-10-CM

## 2018-08-28 DIAGNOSIS — E039 Hypothyroidism, unspecified: Secondary | ICD-10-CM

## 2018-08-28 MED ORDER — TRAZODONE HCL 50 MG PO TABS: 25.0000 mg | ORAL_TABLET | Freq: Every evening | ORAL | 0 refills | Status: DC | PRN

## 2018-08-28 MED ORDER — VALACYCLOVIR HCL 500 MG PO TABS: 500.0000 mg | ORAL_TABLET | Freq: Every day | ORAL | 0 refills | Status: DC

## 2018-08-28 MED ORDER — LEVOTHYROXINE SODIUM 100 MCG PO TABS: 100.0000 ug | ORAL_TABLET | Freq: Every day | ORAL | 0 refills | Status: DC

## 2018-08-28 NOTE — Telephone Encounter (Signed)
Patient advised of recommendations.  

## 2018-08-28 NOTE — Telephone Encounter (Signed)
Hailey Parrish called and states she needs a 6 month supply of all her medications. She states she will be on a boat for 6 months. Please advise.

## 2018-08-28 NOTE — Telephone Encounter (Signed)
Day, I sent all of her prescriptions so that they should fill a 4948-month supply upfront (note for pharmacy on each prescription that this was okay), insurance may not cover this much though, so I would call patient and caution her about this, if the pharmacy has any issues they should contact us directly

## 2018-11-27 ENCOUNTER — Encounter: Payer: Medicare Other | Admitting: Osteopathic Medicine

## 2018-12-07 ENCOUNTER — Telehealth: Payer: Self-pay | Admitting: Osteopathic Medicine

## 2018-12-07 ENCOUNTER — Encounter: Payer: Medicare Other | Admitting: Osteopathic Medicine

## 2018-12-07 NOTE — Telephone Encounter (Signed)
Patient called stating she was [place] in [place] airport and could not make it in. Called after appointment time, rescheduled for another date.

## 2018-12-10 ENCOUNTER — Encounter: Payer: Self-pay | Admitting: Osteopathic Medicine

## 2018-12-10 ENCOUNTER — Ambulatory Visit: Payer: Medicare Other

## 2018-12-10 ENCOUNTER — Ambulatory Visit (INDEPENDENT_AMBULATORY_CARE_PROVIDER_SITE_OTHER): Payer: Medicare Other

## 2018-12-10 ENCOUNTER — Ambulatory Visit (INDEPENDENT_AMBULATORY_CARE_PROVIDER_SITE_OTHER): Payer: Medicare Other | Admitting: Osteopathic Medicine

## 2018-12-10 VITALS — BP 119/70 | HR 58 | Temp 97.6°F | Wt 155.0 lb

## 2018-12-10 DIAGNOSIS — M79671 Pain in right foot: Secondary | ICD-10-CM | POA: Diagnosis not present

## 2018-12-10 DIAGNOSIS — Z780 Asymptomatic menopausal state: Secondary | ICD-10-CM

## 2018-12-10 DIAGNOSIS — M7731 Calcaneal spur, right foot: Secondary | ICD-10-CM | POA: Diagnosis not present

## 2018-12-10 DIAGNOSIS — Z0000 Encounter for general adult medical examination without abnormal findings: Secondary | ICD-10-CM

## 2018-12-10 DIAGNOSIS — M79601 Pain in right arm: Secondary | ICD-10-CM | POA: Diagnosis not present

## 2018-12-10 DIAGNOSIS — E039 Hypothyroidism, unspecified: Secondary | ICD-10-CM

## 2018-12-10 NOTE — Progress Notes (Signed)
HPI: Hailey Parrish is a 73 y.o. female who  has a past medical history of Cardiac risk counseling (08/17/2015), Family history of MI (myocardial infarction) (08/16/2015), Hypothyroidism (08/16/2015), and Postmenopausal (08/16/2015).  she presents to [facility] today, 12/10/18,  for chief complaint of: Annual physical     Patient here for annual physical / wellness exam.  See preventive care reviewed as below.  Recent labs reviewed in detail with the patient.   Additional concerns today include:   Shoulder/arm pain  . Context: worse since being on her boat more often, being more active . Location: R shoulder/upper arm, points to triceps/deltoid area rather than joint . Quality: sharp sometimes, mostly throbbing . Duration: couple months  R heel pain  . Location: base of R heel (central calcaneus, no significant involvement in Achilles or plantar fascia) . Quality: Sharp, stabbing . Timing: With walking . Modifying factors: Helped dramatically by orthotics, which she wears almost constantly     Past medical, surgical, social and family history reviewed:  Patient Active Problem List   Diagnosis Date Noted  . 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 Cardiac risk counseling 08/17/2015  . Foot pain, right 08/17/2015  . Hypothyroidism 08/16/2015  . History of appendectomy 08/16/2015  . Family history of MI (myocardial infarction) 08/16/2015  . Postmenopausal 08/16/2015  . Chronic low back pain 08/16/2015  . Leg pain, lateral 08/16/2015    Past Surgical History:  Procedure Laterality Date  . APPENDECTOMY  1963  . REVISION OF SCAR TISSUE RECTUS MUSCLE  2011    Social History   Tobacco Use  . Smoking status: Never Smoker  . Smokeless tobacco: Never Used  Substance Use Topics  . Alcohol use: Yes    Alcohol/week: 0.0 standard drinks    Comment: 4 per week, moderate     Family History  Problem Relation Age of Onset  . Hyperlipidemia Mother   .  Cancer Father        lung  . Heart attack Father      Current medication list and allergy/intolerance information reviewed:    Current Outpatient Medications  Medication Sig Dispense Refill  . levothyroxine (SYNTHROID, LEVOTHROID) 100 MCG tablet Take 1 tablet (100 mcg total) by mouth daily. 180 tablet 0  . valACYclovir (VALTREX) 500 MG tablet Take 1 tablet (500 mg total) by mouth daily. 180 tablet 0  . traZODone (DESYREL) 50 MG tablet Take 0.5-1 tablets (25-50 mg total) by mouth at bedtime as needed for sleep. (Patient not taking: Reported on 12/10/2018) 180 tablet 0   No current facility-administered medications for this visit.     No Known Allergies    Review of Systems:  Constitutional:  No  fever, no chills, No recent illness, No unintentional weight changes. No significant fatigue.   HEENT: No  headache, no vision change, no hearing change, No sore throat, No  sinus pressure  Cardiac: No  chest pain, No  pressure, No palpitations, No  Orthopnea  Respiratory:  No  shortness of breath. No  Cough  Gastrointestinal: No  abdominal pain, No  nausea, No  vomiting,  No  blood in stool, No  diarrhea, No  constipation   Musculoskeletal: +new myalgia/arthralgia  Skin: No  Rash, No other wounds/concerning lesions  Genitourinary: No  incontinence, No  abnormal genital bleeding, No abnormal genital discharge  Hem/Onc: No  easy bruising/bleeding, No  abnormal lymph node  Endocrine: No cold intolerance,  No heat intolerance. No polyuria/polydipsia/polyphagia   Neurologic: No  weakness, No  dizziness, No  slurred speech/focal weakness/facial droop  Psychiatric: No  concerns with depression, No  concerns with anxiety, No sleep problems, No mood problems  Exam:  BP 119/70 (BP Location: Left Arm, Patient Position: Sitting, Cuff Size: Normal)   Pulse (!) 58   Temp 97.6 F (36.4 C) (Oral)   Wt 155 lb (70.3 kg)   BMI 27.46 kg/m   Constitutional: VS see above. General Appearance:  alert, well-developed, well-nourished, NAD  Eyes: Normal lids and conjunctive, non-icteric sclera  Ears, Nose, Mouth, Throat: MMM, Normal external inspection ears/nares/mouth/lips/gums. TM normal bilaterally. Pharynx/tonsils no erythema, no exudate. Nasal mucosa normal.   Neck: No masses, trachea midline. No thyroid enlargement. No tenderness/mass appreciated. No lymphadenopathy  Respiratory: Normal respiratory effort. no wheeze, no rhonchi, no rales  Cardiovascular: S1/S2 normal, no murmur, no rub/gallop auscultated. RRR. No lower extremity edema.   Gastrointestinal: Nontender, no masses. No hepatomegaly, no splenomegaly. No hernia appreciated. Bowel sounds normal. Rectal exam deferred.   Musculoskeletal: Gait normal. No clubbing/cyanosis of digits.   R shoulder - neg drop arm, neg cross arm, neg apprehension, neg liftoff, neg speeds/yergasons/neers   Neurological: Normal balance/coordination. No tremor. No cranial nerve deficit on limited exam. Motor and sensation intact and symmetric. Cerebellar reflexes intact.   Skin: warm, dry, intact. No rash/ulcer. No concerning nevi or subq nodules on limited exam.    Psychiatric: Normal judgment/insight. Normal mood and affect. Oriented x3.         ASSESSMENT/PLAN: The primary encounter diagnosis was Annual physical exam. Diagnoses of Postmenopausal, Hypothyroidism, unspecified type, Foot pain, right, and Right arm pain were also pertinent to this visit.  Can continue Tylenol or OTC NSAID for pain qhs, pt states this has been helping. XR of foot, would f/u sports med if needed   Orders Placed This Encounter  Procedures  . DG Foot Complete Right  . DG Bone Density  . CBC  . COMPLETE METABOLIC PANEL WITH GFR  . Lipid panel  . TSH  . VITAMIN D 25 Hydroxy (Vit-D Deficiency, Fractures)      Patient Instructions  General Preventive Care  Most recent routine screening lipids/other labs: ordered today to be done fasting tomorrow!     Everyone should have blood pressure checked once per year.   Tobacco: don't!   Alcohol: responsible moderation is ok for most adults - if you have concerns about your alcohol intake, please talk to me!   Exercise: as tolerated to reduce risk of cardiovascular disease and diabetes. Strength training will also prevent osteoporosis.   Mental health: if need for mental health care (medicines, counseling, other), or concerns about moods, please let me know!   Sexual health: if need for STD testing, or if concerns with libido/pain problems, please let me know!  Advanced Directive: Living Will and/or Healthcare Power of Attorney recommended for all adults, regardless of age or health.  Vaccines  Flu vaccine: recommended for almost everyone, every fall.   Shingles vaccine: Shingrix - will call [facility] pharmacy for records   Pneumonia vaccines: all done!  Tetanus booster: Tdap recommended every 10 years. Due 03/2020 Cancer screenings   Colon cancer screening: will track down Cologuard results   Breast cancer screening: mammogram recommended annually after age 34. Due around 04/2019  Cervical cancer screening: stop at age 75 or w/ hysterectomy.   Lung cancer screening: not needed for non-smokers  Infection screenings . HIV, Gonorrhea/Chlamydia: screening as needed. . Hepatitis C: recommended for anyone born 1945-1965, already done!  Marland Kitchen  TB: certain at-risk populations, or depending on work requirements and/or travel history  Other   Bone Density Test: recommended every other year for women at age 65           Orthopedic issues:  If foot and/or arm is not better or if it gets worse, I would recommend follow-up with one of our sports medicine specialists (Dr Denyse Amass or Dr. Cherylann Parr Dr. Karie Schwalbe) for further evaluation in 2-4 weeks. Just call our office and ask for an appointment for sports medicine!     Please note: Preventive care issues were addressed today as part of  your annual physical, and this care should be covered under your insurance. However there were other medical issues which were also addressed today, and insurance may bill you separately for a "problem-based visit" for this care: foot pain and arm pain . Any questions or concerns about charges which may appear on your billing statement should be directed to your insurance company or to [facility] billing department, and they will contact our office if there are further concerns.             Visit summary with medication list and pertinent instructions was printed for patient to review. All questions at time of visit were answered - patient instructed to contact office with any additional concerns or updates. ER/RTC precautions were reviewed with the patient.   Please note: voice recognition software was used to produce this document, and typos may escape review. Please contact Dr. Lyn Hollingshead for any needed clarifications.     Follow-up plan: Return in about 4 weeks (around 01/07/2019) for MEDICARE WELLNESS VISIT W/ KIM .

## 2018-12-10 NOTE — Patient Instructions (Signed)
General Preventive Care  Most recent routine screening lipids/other labs: ordered today to be done fasting tomorrow!    Everyone should have blood pressure checked once per year.   Tobacco: don't!   Alcohol: responsible moderation is ok for most adults - if you have concerns about your alcohol intake, please talk to me!   Exercise: as tolerated to reduce risk of cardiovascular disease and diabetes. Strength training will also prevent osteoporosis.   Mental health: if need for mental health care (medicines, counseling, other), or concerns about moods, please let me know!   Sexual health: if need for STD testing, or if concerns with libido/pain problems, please let me know!  Advanced Directive: Living Will and/or Healthcare Power of Attorney recommended for all adults, regardless of age or health.  Vaccines  Flu vaccine: recommended for almost everyone, every fall.   Shingles vaccine: Shingrix - will [facility] pharmacy for records   Pneumonia vaccines: all done!  Tetanus booster: Tdap recommended every 10 years. Due 03/2020 Cancer screenings   Colon cancer screening: will track down Cologuard results   Breast cancer screening: mammogram recommended annually after age 78. Due around 04/2019  Cervical cancer screening: stop at age 85 or w/ hysterectomy.   Lung cancer screening: not needed for non-smokers  Infection screenings . HIV, Gonorrhea/Chlamydia: screening as needed. . Hepatitis C: recommended for anyone born 1945-1965, already done!  . TB: certain at-risk populations, or depending on work requirements and/or travel history  Other   Bone Density Test: recommended every other year for women at age 63           Orthopedic issues:  If foot and/or arm is not better or if it gets worse, I would recommend follow-up with one of our sports medicine specialists (Dr Denyse Amass or Dr. Cherylann Parr Dr. Karie Schwalbe) for further evaluation in 2-4 weeks. Just call our office and  ask for an appointment for sports medicine!     Please note: Preventive care issues were addressed today as part of your annual physical, and this care should be covered under your insurance. However there were other medical issues which were also addressed today, and insurance may bill you separately for a "problem-based visit" for this care: foot pain and arm pain . Any questions or concerns about charges which may appear on your billing statement should be directed to your insurance company or to [facility] billing department, and they will contact our office if there are further concerns.

## 2018-12-11 NOTE — Addendum Note (Signed)
Addended by: Deirdre Pippins on: 12/11/2018 11:38 AM   Modules accepted: Orders

## 2018-12-12 LAB — COMPLETE METABOLIC PANEL WITH GFR
AG Ratio: 1.6 (calc) (ref 1.0–2.5)
ALT: 15 U/L (ref 6–29)
AST: 19 U/L (ref 10–35)
Albumin: 4.2 g/dL (ref 3.6–5.1)
Alkaline phosphatase (APISO): 67 U/L (ref 37–153)
BUN: 16 mg/dL (ref 7–25)
CALCIUM: 9.8 mg/dL (ref 8.6–10.4)
CO2: 28 mmol/L (ref 20–32)
CREATININE: 0.89 mg/dL (ref 0.60–0.93)
Chloride: 105 mmol/L (ref 98–110)
GFR, EST AFRICAN AMERICAN: 76 mL/min/{1.73_m2} (ref >=60)
GFR, EST NON AFRICAN AMERICAN: 66 mL/min/{1.73_m2} (ref >=60)
GLUCOSE: 90 mg/dL (ref 65–99)
Globulin: 2.6 g/dL (calc) (ref 1.9–3.7)
Potassium: 4.5 mmol/L (ref 3.5–5.3)
Sodium: 141 mmol/L (ref 135–146)
TOTAL PROTEIN: 6.8 g/dL (ref 6.1–8.1)
Total Bilirubin: 0.8 mg/dL (ref 0.2–1.2)

## 2018-12-12 LAB — LIPID PANEL
Cholesterol: 211 mg/dL — ABNORMAL HIGH (ref <200)
HDL: 58 mg/dL (ref >=50)
LDL CHOLESTEROL (CALC): 135 mg/dL — AB
NON-HDL CHOLESTEROL (CALC): 153 mg/dL — AB (ref <130)
TRIGLYCERIDES: 85 mg/dL (ref <150)
Total CHOL/HDL Ratio: 3.6 (calc) (ref <5.0)

## 2018-12-12 LAB — CBC
HEMATOCRIT: 42.8 % (ref 35.0–45.0)
Hemoglobin: 14.3 g/dL (ref 11.7–15.5)
MCH: 30.2 pg (ref 27.0–33.0)
MCHC: 33.4 g/dL (ref 32.0–36.0)
MCV: 90.5 fL (ref 80.0–100.0)
MPV: 10.4 fL (ref 7.5–12.5)
PLATELETS: 286 10*3/uL (ref 140–400)
RBC: 4.73 10*6/uL (ref 3.80–5.10)
RDW: 11.9 % (ref 11.0–15.0)
WBC: 4.1 10*3/uL (ref 3.8–10.8)

## 2018-12-12 LAB — VITAMIN D 25 HYDROXY (VIT D DEFICIENCY, FRACTURES): VIT D 25 HYDROXY: 30 ng/mL (ref 30–100)

## 2018-12-12 LAB — TSH: TSH: 1.21 mIU/L (ref 0.40–4.50)

## 2018-12-23 ENCOUNTER — Ambulatory Visit (INDEPENDENT_AMBULATORY_CARE_PROVIDER_SITE_OTHER): Payer: Medicare Other

## 2018-12-23 ENCOUNTER — Encounter: Payer: Self-pay | Admitting: Podiatry

## 2018-12-23 ENCOUNTER — Other Ambulatory Visit: Payer: Self-pay | Admitting: Podiatry

## 2018-12-23 ENCOUNTER — Ambulatory Visit (INDEPENDENT_AMBULATORY_CARE_PROVIDER_SITE_OTHER): Payer: Medicare Other | Admitting: Podiatry

## 2018-12-23 ENCOUNTER — Other Ambulatory Visit: Payer: Self-pay

## 2018-12-23 VITALS — BP 140/84 | HR 59

## 2018-12-23 DIAGNOSIS — M79671 Pain in right foot: Secondary | ICD-10-CM

## 2018-12-23 DIAGNOSIS — N83209 Unspecified ovarian cyst, unspecified side: Secondary | ICD-10-CM | POA: Insufficient documentation

## 2018-12-23 DIAGNOSIS — N952 Postmenopausal atrophic vaginitis: Secondary | ICD-10-CM | POA: Insufficient documentation

## 2018-12-23 DIAGNOSIS — M722 Plantar fascial fibromatosis: Secondary | ICD-10-CM | POA: Diagnosis not present

## 2018-12-23 DIAGNOSIS — M79672 Pain in left foot: Secondary | ICD-10-CM

## 2018-12-23 MED ORDER — TRIAMCINOLONE ACETONIDE 10 MG/ML IJ SUSP
10.0000 mg | Freq: Once | INTRAMUSCULAR | Status: AC
  Administered 2018-12-23: 10 mg

## 2018-12-23 NOTE — Progress Notes (Signed)
Subjective:   Patient ID: Hailey Parrish, female   DOB: 73 y.o.   MRN: [id number]   HPI Patient presents with exquisite discomfort in the plantar heel right and left foot at the insertion of the tendon into the calcaneus with inflammation fluid buildup.  Admits it is been going on for a long time but it has worsened over the last few months and is difficult for her to be active at this point.  Patient does not smoke and likes to be active   Review of Systems  All other systems reviewed and are negative.       Objective:  Physical Exam Vitals signs and nursing note reviewed.  Constitutional:      Appearance: She is well-developed.  Pulmonary:     Effort: Pulmonary effort is normal.  Musculoskeletal: Normal range of motion.  Skin:    General: Skin is warm.  Neurological:     Mental Status: She is alert.     Neurovascular status intact muscle strength is adequate range of motion was found to be within normal limits.  Patient is found to have exquisite discomfort plantar heel region right over left with inflammation fluid of the medial band at its insertion into the calcaneus with moderate depression of the arch noted.  Patient has good digital perfusion and is well oriented x3     Assessment:  Acute plantar fasciitis of the heel right over left with inflammation fluid of the medial band     Plan:  H&P condition reviewed and I went ahead today and I injected the plantar fascia bilateral after sterile prep 3 mg Kenalog 5 mg Xylocaine and for the right I applied fascial brace to lift the arch up.  Reappoint 2 weeks to reevaluate and encouraged to make shoe gear modifications until that time  X-rays indicate small spur with no indication to stress fracture arthritis

## 2018-12-23 NOTE — Patient Instructions (Signed)

## 2018-12-30 ENCOUNTER — Encounter: Payer: Self-pay | Admitting: Podiatry

## 2018-12-30 ENCOUNTER — Other Ambulatory Visit: Payer: Self-pay

## 2018-12-30 ENCOUNTER — Ambulatory Visit (INDEPENDENT_AMBULATORY_CARE_PROVIDER_SITE_OTHER): Payer: Medicare Other | Admitting: Podiatry

## 2018-12-30 DIAGNOSIS — M722 Plantar fascial fibromatosis: Secondary | ICD-10-CM | POA: Diagnosis not present

## 2018-12-30 DIAGNOSIS — M79672 Pain in left foot: Secondary | ICD-10-CM | POA: Diagnosis not present

## 2018-12-30 DIAGNOSIS — M79671 Pain in right foot: Secondary | ICD-10-CM

## 2018-12-30 NOTE — Progress Notes (Signed)
Subjective:   Patient ID: Hailey Parrish, female   DOB: 73 y.o.   MRN: [id number]   HPI Patient states my feet are feeling a lot better and the brace seems to also help but so far I am doing much better with pain   ROS      Objective:  Physical Exam  Neurovascular status intact with patient's feet improving with pain still present upon deep palpation but much better than previously     Assessment:  Improving fasciitis-like symptoms bilateral     Plan:  H&P reviewed condition and at this point I do think physical therapy at home anti-inflammatories and supportive shoe gear are the best alternative for this patient and I spent a great deal time going over all these different alternatives and that we will get a try to let her go and she will be seen back as needed

## 2019-01-18 ENCOUNTER — Other Ambulatory Visit: Payer: Self-pay

## 2019-01-31 NOTE — Progress Notes (Signed)
Subjective:   Kiaralee Valdiviezo is a 73 y.o. female who presents for Medicare Annual (Subsequent) preventive examination.  Review of Systems:  No ROS.  Medicare Wellness Visit. Additional risk factors are reflected in the social history.  Cardiac Risk Factors include: advanced age (>43men, >70 women);hypertension Sleep patterns: Getting 7-8 hours of sleep a night. Wakes up at least 1 time a night to go to the bathroom.Wakes up feeling rested.  Home Safety/Smoke Alarms: Feels safe in home. Smoke alarms in place.  Living environment; Lives with husband in a 2 story home stairs have hand rails in place. SHower is a walk in shower with grab bars in place. Seat Belt Safety/Bike Helmet: Wears seat belt.   Female:   Pap- Aged out      Mammo-  UTD     Dexa scan- has this scheduled       CCS- UTD Cologuard     Objective:     Vitals: There were no vitals taken for this visit.  There is no height or weight on file to calculate BMI.  Advanced Directives 02/01/2019 08/21/2015  Does Patient Have a Medical Advance Directive? Yes Yes  Type of Estate agent of [place];Living will -  Does patient want to make changes to medical advance directive? No - Patient declined -  Copy of Healthcare Power of Attorney in Chart? No - copy requested Yes    Tobacco Social History   Tobacco Use  Smoking Status Never Smoker  Smokeless Tobacco Never Used     Counseling given: Not Answered   Clinical Intake:  Pre-visit preparation completed: Yes  Pain : No/denies pain     Nutritional Risks: None Diabetes: No  What is the last grade level you completed in school?: 16  Interpreter Needed?: No  Information entered by :: Carolin Sicks, LPN  Past Medical History:  Diagnosis Date  . Cardiac risk counseling 08/17/2015   ASCVD 10 year risk 6.7% calculated 08/2015  . Family history of MI (myocardial infarction) 08/16/2015   father  . Hypothyroidism 08/16/2015  . Postmenopausal  08/16/2015   Past Surgical History:  Procedure Laterality Date  . APPENDECTOMY  1963  . REVISION OF SCAR TISSUE RECTUS MUSCLE  2011   Family History  Problem Relation Age of Onset  . Hyperlipidemia Mother   . Cancer Father        lung  . Heart attack Father    Social History   Socioeconomic History  . Marital status: Married    Spouse name: Gala Romney  . Number of children: 3  . Years of education: 16  . Highest education level: Bachelor's degree (e.g., BA, AB, BS)  Occupational History  . Occupation: Runner, broadcasting/film/video    Comment: retired  Engineer, production  . Financial resource strain: Not hard at all  . Food insecurity:    Worry: Never true    Inability: Never true  . Transportation needs:    Medical: No    Non-medical: No  Tobacco Use  . Smoking status: Never Smoker  . Smokeless tobacco: Never Used  Substance and Sexual Activity  . Alcohol use: Yes    Alcohol/week: 0.0 standard drinks    Comment: 4 per week, moderate   . Drug use: No  . Sexual activity: Yes  Lifestyle  . Physical activity:    Days per week: 0 days    Minutes per session: 0 min  . Stress: Not at all  Relationships  . Social connections:  Talks on phone: More than three times a week    Gets together: Twice a week    Attends religious service: More than 4 times per year    Active member of club or organization: No    Attends meetings of clubs or organizations: Never    Relationship status: Married  Other Topics Concern  . Not on file  Social History Narrative   No exercise.   Caffeine- 1 cup of coffee a day    Outpatient Encounter Medications as of 02/01/2019  Medication Sig  . levothyroxine (SYNTHROID, LEVOTHROID) 100 MCG tablet Take 1 tablet (100 mcg total) by mouth daily.  . valACYclovir (VALTREX) 500 MG tablet Take 1 tablet (500 mg total) by mouth daily.  Marland Kitchen VITAMIN D PO Take 1 tablet by mouth daily.   No facility-administered encounter medications on file as of 02/01/2019.     Activities of Daily  Living In your present state of health, do you have any difficulty performing the following activities: 02/01/2019  Hearing? N  Vision? N  Comment wears reading glasses  Difficulty concentrating or making decisions? N  Walking or climbing stairs? N  Dressing or bathing? N  Doing errands, shopping? N  Preparing Food and eating ? N  Using the Toilet? N  In the past six months, have you accidently leaked urine? N  Do you have problems with loss of bowel control? N  Managing your Medications? N  Managing your Finances? N  Housekeeping or managing your Housekeeping? N  Some recent data might be hidden    Patient Care Team: Sunnie Nielsen, DO as PCP - General (Osteopathic Medicine)    Assessment:   This is a routine wellness examination for Lane.Physical assessment deferred to PCP.   Exercise Activities and Dietary recommendations Current Exercise Habits: The patient does not participate in regular exercise at present, Exercise limited by: None identified Diet eats a healthy diet of fish, vegetables and fruit and chicken. Breakfast: egg and bacon and toast Lunch: 1/2 sandwich with fruit or salad Dinner:  Meat and vegetables Drinks water daily     Goals    . Exercise 3x per week (30 min per time)     Patient states that she would like to start back exercising.       Fall Risk Fall Risk  02/01/2019 11/25/2016 11/25/2016 11/16/2015  Falls in the past year? 0 No No No  Follow up Falls prevention discussed - - -   Is the patient's home free of loose throw rugs in walkways, pet beds, electrical cords, etc?   yes      Grab bars in the bathroom? yes      Handrails on the stairs?   yes      Adequate lighting?   yes  Depression Screen PHQ 2/9 Scores 02/01/2019 12/10/2018 06/02/2018 11/26/2017  PHQ - 2 Score 0 0 0 0  PHQ- 9 Score - - 3 -     Cognitive Function     6CIT Screen 02/01/2019  What Year? 0 points  What month? 0 points  What time? 0 points  Count back from 20 0  points  Months in reverse 0 points  Repeat phrase 0 points  Total Score 0    Immunization History  Administered Date(s) Administered  . Influenza Inj Mdck Quad With Preservative 07/14/2017, 07/29/2018  . Influenza-Unspecified 07/22/2015, 08/07/2016, 05/07/2017  . Pneumococcal Conjugate-13 11/25/2016  . Pneumococcal Polysaccharide-23 10/26/2013, 11/01/2014  . Td 08/30/2005  . Tdap 04/04/2010  .  Zoster 11/16/2015, 11/21/2015  . Zoster Recombinat (Shingrix) 07/29/2018, 11/18/2018    Screening Tests Health Maintenance  Topic Date Due  . INFLUENZA VACCINE  05/08/2019  . TETANUS/TDAP  04/04/2020  . MAMMOGRAM  04/29/2020  . Fecal DNA (Cologuard)  12/02/2020  . DEXA SCAN  Completed  . Hepatitis C Screening  Completed  . PNA vac Low Risk Adult  Completed       Plan:    Please schedule your next medicare wellness visit with me in 1 yr.  Ms. Roderic ScarceJewell , Thank you for taking time to come for your Medicare Wellness Visit. I appreciate your ongoing commitment to your health goals. Please review the following plan we discussed and let me know if I can assist you in the future.   These are the goals we discussed: Goals    . Exercise 3x per week (30 min per time)     Patient states that she would like to start back exercising.       This is a list of the screening recommended for you and due dates:  Health Maintenance  Topic Date Due  . Flu Shot  05/08/2019  . Tetanus Vaccine  04/04/2020  . Mammogram  04/29/2020  . Cologuard (Stool DNA test)  12/02/2020  . DEXA scan (bone density measurement)  Completed  .  Hepatitis C: One time screening is recommended by Center for Disease Control  (CDC) for  adults born from [birth year] through 1963.   Completed  . Pneumonia vaccines  Completed      I have personally reviewed and noted the following in the patient's chart:   . Medical and social history . Use of alcohol, tobacco or illicit drugs  . Current medications and  supplements . Functional ability and status . Nutritional status . Physical activity . Advanced directives . List of other physicians . Hospitalizations, surgeries, and ER visits in previous 12 months . Vitals . Screenings to include cognitive, depression, and falls . Referrals and appointments  In addition, I have reviewed and discussed with patient certain preventive protocols, quality metrics, and best practice recommendations. A written personalized care plan for preventive services as well as general preventive health recommendations were provided to patient.     Normand SloopKimberly A , LPN  [id number]/27/2020

## 2019-02-01 ENCOUNTER — Ambulatory Visit (INDEPENDENT_AMBULATORY_CARE_PROVIDER_SITE_OTHER): Payer: Medicare Other | Admitting: *Deleted

## 2019-02-01 DIAGNOSIS — Z0000 Encounter for general adult medical examination without abnormal findings: Secondary | ICD-10-CM

## 2019-02-01 NOTE — Patient Instructions (Addendum)
Please schedule your next medicare wellness visit with me in 1 yr.  Hailey Parrish , Thank you for taking time to come for your Medicare Wellness Visit. I appreciate your ongoing commitment to your health goals. Please review the following plan we discussed and let me know if I can assist you in the future.   These are the goals we discussed: Goals    . Exercise 3x per week (30 min per time)     Patient states that she would like to start back exercising.

## 2019-02-03 ENCOUNTER — Other Ambulatory Visit: Payer: Medicare Other

## 2019-03-10 ENCOUNTER — Other Ambulatory Visit: Payer: Self-pay

## 2019-03-10 ENCOUNTER — Ambulatory Visit (INDEPENDENT_AMBULATORY_CARE_PROVIDER_SITE_OTHER): Payer: Medicare Other

## 2019-03-10 DIAGNOSIS — Z780 Asymptomatic menopausal state: Secondary | ICD-10-CM

## 2019-04-07 ENCOUNTER — Other Ambulatory Visit: Payer: Self-pay | Admitting: Osteopathic Medicine

## 2019-04-07 ENCOUNTER — Other Ambulatory Visit: Payer: Self-pay

## 2019-04-07 ENCOUNTER — Ambulatory Visit (INDEPENDENT_AMBULATORY_CARE_PROVIDER_SITE_OTHER): Payer: Medicare Other | Admitting: Podiatry

## 2019-04-07 ENCOUNTER — Encounter: Payer: Self-pay | Admitting: Podiatry

## 2019-04-07 VITALS — Temp 98.0°F

## 2019-04-07 DIAGNOSIS — M722 Plantar fascial fibromatosis: Secondary | ICD-10-CM | POA: Diagnosis not present

## 2019-04-07 DIAGNOSIS — E039 Hypothyroidism, unspecified: Secondary | ICD-10-CM

## 2019-04-07 NOTE — Telephone Encounter (Signed)
Forwarding medication refill to PCP for review. 

## 2019-04-07 NOTE — Progress Notes (Signed)
Subjective:   Patient ID: Hailey Parrish, female   DOB: 73 y.o.   MRN: [id number]   HPI Patient states that she has had a reoccurrence of pain in the bottom of the right heel and it seems like she had about 2 months of relief   ROS      Objective:  Physical Exam  Motor status intact with patient noted to have exquisite discomfort of the plantar fascial right at the insertion of the tendon into the calcaneus     Assessment:  Plantar fasciitis right with inflammation fluid present     Plan:  H&P condition reviewed and at this point I did go ahead and discussed more chronic treatment and dispensed night splint with all instructions on usage along with heat ice therapy.  I did do sterile prep and injected the acute plantar fascial insertion calcaneus 3 mg Kenalog 5 mg Xylocaine

## 2019-04-15 ENCOUNTER — Ambulatory Visit (INDEPENDENT_AMBULATORY_CARE_PROVIDER_SITE_OTHER): Payer: Medicare Other | Admitting: Osteopathic Medicine

## 2019-04-15 ENCOUNTER — Encounter: Payer: Self-pay | Admitting: Osteopathic Medicine

## 2019-04-15 ENCOUNTER — Ambulatory Visit: Payer: Medicare Other | Admitting: Osteopathic Medicine

## 2019-04-15 DIAGNOSIS — L237 Allergic contact dermatitis due to plants, except food: Secondary | ICD-10-CM | POA: Diagnosis not present

## 2019-04-15 MED ORDER — PREDNISONE 10 MG (48) PO TBPK: ORAL_TABLET | Freq: Every day | ORAL | 0 refills | Status: DC

## 2019-04-15 MED ORDER — BETAMETHASONE DIPROPIONATE 0.05 % EX CREA: TOPICAL_CREAM | Freq: Two times a day (BID) | CUTANEOUS | 1 refills | Status: AC

## 2019-04-15 NOTE — Progress Notes (Signed)
Virtual Visit via Video (App used: Doximity) Note  I connected with      Hailey FitchNancy Leatherbury on 04/15/19 at 1:40 by a telemedicine application and verified that I am speaking with the correct person using two identifiers.  Patient is at home I am in office   I discussed the limitations of evaluation and management by telemedicine and the availability of in person appointments. The patient expressed understanding and agreed to proceed.  History of Present Illness: Hailey Parrish is a 73 y.o. female who would like to discuss poison ivy rash   . Location: arms bilateral . Quality: extremely itching . Severity: worse: spreading and increased itch  . Duration: 1 week . Context: after yard work  . Modifying factors: benadryl OTC not helping       Observations/Objective: There were no vitals taken for this visit. BP Readings from Last 3 Encounters:  12/23/18 140/84  12/10/18 119/70  06/02/18 124/64   Exam: Normal Speech.  NAD  Lab and Radiology Results No results found for this or any previous visit (from the past 72 hour(s)). No results found.     Assessment and Plan: 73 y.o. female with The encounter diagnosis was Poison ivy dermatitis.   PDMP not reviewed this encounter. No orders of the defined types were placed in this encounter.  Meds ordered this encounter  Medications  . predniSONE (STERAPRED UNI-PAK 48 TAB) 10 MG (48) TBPK tablet    Sig: Take by mouth daily. 12-Day taper, po    Dispense:  48 tablet    Refill:  0  . betamethasone dipropionate (DIPROLENE) 0.05 % cream    Sig: Apply topically 2 (two) times daily. To affected area(s) as needed    Dispense:  45 g    Refill:  1   There are no Patient Instructions on file for this visit.  Instructions sent via MyChart. If MyChart not available, pt was given option for info via personal e-mail w/ no guarantee of protected health info over unsecured e-mail communication, and MyChart sign-up instructions were  included.   Follow Up Instructions: Return if symptoms worsen or fail to improve.    I discussed the assessment and treatment plan with the patient. The patient was provided an opportunity to ask questions and all were answered. The patient agreed with the plan and demonstrated an understanding of the instructions.   The patient was advised to call back or seek an in-person evaluation if any new concerns, if symptoms worsen or if the condition fails to improve as anticipated.  15 minutes of non-face-to-face time was provided during this encounter.                      Historical information moved to improve visibility of documentation.  Past Medical History:  Diagnosis Date  . Cardiac risk counseling 08/17/2015   ASCVD 10 year risk 6.7% calculated 08/2015  . Family history of MI (myocardial infarction) 08/16/2015   father  . Hypothyroidism 08/16/2015  . Postmenopausal 08/16/2015   Past Surgical History:  Procedure Laterality Date  . APPENDECTOMY  1963  . REVISION OF SCAR TISSUE RECTUS MUSCLE  2011   Social History   Tobacco Use  . Smoking status: Never Smoker  . Smokeless tobacco: Never Used  Substance Use Topics  . Alcohol use: Yes    Alcohol/week: 0.0 standard drinks    Comment: 4 per week, moderate    family history includes Cancer in her father; Heart attack in  her father; Hyperlipidemia in her mother.  Medications: Current Outpatient Medications  Medication Sig Dispense Refill  . levothyroxine (SYNTHROID) 100 MCG tablet Take 1 tablet (100 mcg total) by mouth daily. 90 tablet 3  . valACYclovir (VALTREX) 500 MG tablet Take 1 tablet (500 mg total) by mouth daily. 180 tablet 0  . VITAMIN D PO Take 1 tablet by mouth daily.    . betamethasone dipropionate (DIPROLENE) 0.05 % cream Apply topically 2 (two) times daily. To affected area(s) as needed 45 g 1  . predniSONE (STERAPRED UNI-PAK 48 TAB) 10 MG (48) TBPK tablet Take by mouth daily. 12-Day taper, po 48  tablet 0   No current facility-administered medications for this visit.    No Known Allergies  PDMP not reviewed this encounter. No orders of the defined types were placed in this encounter.  Meds ordered this encounter  Medications  . predniSONE (STERAPRED UNI-PAK 48 TAB) 10 MG (48) TBPK tablet    Sig: Take by mouth daily. 12-Day taper, po    Dispense:  48 tablet    Refill:  0  . betamethasone dipropionate (DIPROLENE) 0.05 % cream    Sig: Apply topically 2 (two) times daily. To affected area(s) as needed    Dispense:  45 g    Refill:  1

## 2019-05-06 ENCOUNTER — Ambulatory Visit (INDEPENDENT_AMBULATORY_CARE_PROVIDER_SITE_OTHER): Payer: Medicare Other | Admitting: Podiatry

## 2019-05-06 ENCOUNTER — Other Ambulatory Visit: Payer: Self-pay

## 2019-05-06 ENCOUNTER — Encounter: Payer: Self-pay | Admitting: Podiatry

## 2019-05-06 VITALS — Temp 98.1°F

## 2019-05-06 DIAGNOSIS — M722 Plantar fascial fibromatosis: Secondary | ICD-10-CM

## 2019-05-06 MED ORDER — DICLOFENAC SODIUM 75 MG PO TBEC: 75.0000 mg | DELAYED_RELEASE_TABLET | Freq: Two times a day (BID) | ORAL | 2 refills | Status: AC

## 2019-05-06 NOTE — Progress Notes (Signed)
Subjective:   Patient ID: Hailey Parrish, female   DOB: 73 y.o.   MRN: [id number]   HPI Patient presents stating her right heel has really flared up and she is moving and on her heel a lot and is currently moving to [employer]   ROS      Objective:  Physical Exam  Neurovascular status intact with intense discomfort plantar heel right at the insertional point tendon calcaneus with inflammation fluid buildup     Assessment:  Acute plantar fasciitis right that so far has not settled down     Plan:  We will get a start her on anti-inflammatory Voltaren medication 75 mg and I did do sterile prep and reinjected the fascia 3 mg Kenalog 5 mg Xylocaine and will be seen back to recheck again and may require other treatments depending on response

## 2019-05-10 ENCOUNTER — Ambulatory Visit: Payer: Medicare Other | Admitting: Podiatry

## 2019-05-21 ENCOUNTER — Other Ambulatory Visit: Payer: Self-pay | Admitting: Osteopathic Medicine

## 2019-05-21 DIAGNOSIS — A6000 Herpesviral infection of urogenital system, unspecified: Secondary | ICD-10-CM

## 2019-09-07 ENCOUNTER — Other Ambulatory Visit: Payer: Self-pay | Admitting: Osteopathic Medicine

## 2019-09-07 ENCOUNTER — Ambulatory Visit (INDEPENDENT_AMBULATORY_CARE_PROVIDER_SITE_OTHER): Payer: Medicare Other

## 2019-09-07 ENCOUNTER — Encounter: Payer: Self-pay | Admitting: Osteopathic Medicine

## 2019-09-07 ENCOUNTER — Other Ambulatory Visit: Payer: Self-pay

## 2019-09-07 ENCOUNTER — Ambulatory Visit (INDEPENDENT_AMBULATORY_CARE_PROVIDER_SITE_OTHER): Payer: Medicare Other | Admitting: Osteopathic Medicine

## 2019-09-07 VITALS — BP 139/71 | HR 57 | Temp 97.1°F | Wt 157.0 lb

## 2019-09-07 DIAGNOSIS — M25551 Pain in right hip: Secondary | ICD-10-CM

## 2019-09-07 DIAGNOSIS — M5410 Radiculopathy, site unspecified: Secondary | ICD-10-CM | POA: Diagnosis not present

## 2019-09-07 DIAGNOSIS — M79605 Pain in left leg: Secondary | ICD-10-CM

## 2019-09-07 DIAGNOSIS — Z1231 Encounter for screening mammogram for malignant neoplasm of breast: Secondary | ICD-10-CM

## 2019-09-07 DIAGNOSIS — M25552 Pain in left hip: Secondary | ICD-10-CM

## 2019-09-07 MED ORDER — PREDNISONE 10 MG (48) PO TBPK: ORAL_TABLET | Freq: Every day | ORAL | 0 refills | Status: AC

## 2019-09-07 MED ORDER — MELOXICAM 7.5 MG PO TABS: 7.5000 mg | ORAL_TABLET | Freq: Two times a day (BID) | ORAL | 0 refills | Status: AC

## 2019-09-07 NOTE — Progress Notes (Signed)
HPI: Hailey Parrish is a 73 y.o. female who  has a past medical history of Cardiac risk counseling (08/17/2015), Family history of MI (myocardial infarction) (08/16/2015), Hypothyroidism (08/16/2015), and Postmenopausal (08/16/2015).  she presents to [facility] today, 09/07/19,  for chief complaint of:  Leg pain   . Context: no injury or significant history of repetitive strain  . Location: more on L than R but both are affected, she points to inner thigh from mid-anterior knee up towars ASIS/hip and somewhat into groin area, says also occasionlly feels sore in the anterior shin  . Quality: burning occasionally sharp pain, feels deep in the muscle  . Severity/Duration: 1-2 months, initially bad then better now worse again  . Modifying factors: feels worse at rest, better with walking. No OTC meds or home remedies tried, no stretches/exercises tried  . Assoc signs/symptoms: no saddle anesthesia or incontinence       At today's visit 09/07/19 ... PMH, PSH, FH reviewed and updated as needed.  Current medication list and allergy/intolerance hx reviewed and updated as needed. (See remainder of HPI, ROS, Phys Exam below)   Lumbar XR 08/2015 "There is degenerative facet joint change at from L3-S1 and degenerative disc space narrowing at L3-4. There is no acute bony Abnormality."    ASSESSMENT/PLAN: The primary encounter diagnosis was Leg pain, anterior, left. A diagnosis of Radicular leg pain was also pertinent to this visit.   Seems radicular in nature, intermittent, no lower back pain or hip pain specifically though etiology could like there. No rash or sensitivity to touch to make me worried about shingles. No injury. Will trial emds as below, XR and possibly MRI  Orders Placed This Encounter  Procedures  . DG Lumbar Spine Complete     Meds ordered this encounter  Medications  . meloxicam (MOBIC) 7.5 MG tablet    Sig: Take 1 tablet (7.5 mg  total) by mouth 2 (two) times daily.    Dispense:  60 tablet    Refill:  0  . predniSONE (STERAPRED UNI-PAK 48 TAB) 10 MG (48) TBPK tablet    Sig: Take by mouth daily. 12-Day taper, po    Dispense:  48 tablet    Refill:  0        Follow-up plan: Return for RECHECK PENDING XRAY RESULTS / IF LEG PAIN WORSE OR CHANGE, HOW IT RESPONDS TO MEDICINES .                                                 ################################################# ################################################# ################################################# #################################################    No outpatient medications have been marked as taking for the 09/07/19 encounter (Appointment) with Sunnie Nielsen, DO.    No Known Allergies     Review of Systems:  Constitutional: No recent illness  HEENT: No  headache, no vision change  Cardiac: No  chest pain, No  pressure, No palpitations  Respiratory:  No  shortness of breath. No  Cough  Gastrointestinal: No  abdominal pain, no change on bowel habits  Musculoskeletal: +new myalgia/arthralgia per HPI  Skin: No  Rash  Hem/Onc: No  easy bruising/bleeding, No  abnormal lumps/bumps  Neurologic: No  weakness, No  Dizziness  Psychiatric: No  concerns with depression, No  concerns with anxiety  Exam:  BP 139/71 (BP Location: Left Arm, Patient Position: Sitting, Cuff Size: Normal)  Pulse (!) 57   Temp (!) 97.1 F (36.2 C) (Oral)   Wt 157 lb 0.6 oz (71.2 kg)   BMI 27.82 kg/m   Constitutional: VS see above. General Appearance: alert, well-developed, well-nourished, NAD  Neck: No masses, trachea midline.   Respiratory: Normal respiratory effort.   Musculoskeletal: Gait normal. Symmetric and independent movement of all extremities  Normal strength 5/5 hip fflex/ext, knee flex/ex, hip abd/adduct, neg FABER/FADIR, neg SLR bilaterally   Abdominal: non-tender,  non-distended,  Neurological: Normal balance/coordination. No tremor.  Skin: warm, dry, intact.   Psychiatric: Normal judgment/insight. Normal mood and affect. Oriented x3.       Visit summary with medication list and pertinent instructions was printed for patient to review, patient was advised to alert [place] if any updates are needed. All questions at time of visit were answered - patient instructed to contact office with any additional concerns. ER/RTC precautions were reviewed with the patient and understanding verbalized.      Please note: voice recognition software was used to produce this document, and typos may escape review. Please contact Dr. Sheppard Coil for any needed clarifications.    Follow up plan: Return for RECHECK PENDING XRAY RESULTS / IF LEG PAIN WORSE OR CHANGE, HOW IT RESPONDS TO MEDICINES .

## 2019-09-08 ENCOUNTER — Ambulatory Visit (INDEPENDENT_AMBULATORY_CARE_PROVIDER_SITE_OTHER): Payer: Medicare Other

## 2019-09-08 DIAGNOSIS — Z1231 Encounter for screening mammogram for malignant neoplasm of breast: Secondary | ICD-10-CM

## 2019-09-16 ENCOUNTER — Ambulatory Visit: Payer: Medicare Other | Admitting: Osteopathic Medicine

## 2019-10-12 DIAGNOSIS — D509 Iron deficiency anemia, unspecified: Secondary | ICD-10-CM | POA: Diagnosis not present

## 2019-10-12 DIAGNOSIS — B009 Herpesviral infection, unspecified: Secondary | ICD-10-CM | POA: Diagnosis not present

## 2019-10-12 DIAGNOSIS — E039 Hypothyroidism, unspecified: Secondary | ICD-10-CM | POA: Diagnosis not present

## 2019-10-12 DIAGNOSIS — Z7689 Persons encountering health services in other specified circumstances: Secondary | ICD-10-CM | POA: Diagnosis not present

## 2019-10-12 DIAGNOSIS — E782 Mixed hyperlipidemia: Secondary | ICD-10-CM | POA: Diagnosis not present

## 2019-10-12 DIAGNOSIS — M79605 Pain in left leg: Secondary | ICD-10-CM | POA: Diagnosis not present

## 2019-10-12 DIAGNOSIS — E559 Vitamin D deficiency, unspecified: Secondary | ICD-10-CM | POA: Diagnosis not present

## 2019-10-12 DIAGNOSIS — I739 Peripheral vascular disease, unspecified: Secondary | ICD-10-CM | POA: Diagnosis not present

## 2019-11-17 DIAGNOSIS — M7989 Other specified soft tissue disorders: Secondary | ICD-10-CM | POA: Diagnosis not present

## 2019-11-17 DIAGNOSIS — M79605 Pain in left leg: Secondary | ICD-10-CM | POA: Diagnosis not present

## 2019-11-19 ENCOUNTER — Telehealth: Payer: Self-pay

## 2019-11-19 NOTE — Telephone Encounter (Signed)
Pt left a vm msg - wanted provider to be aware she has moved away and has a new provider in [place]. Her new PCP is requesting latest X-ray results to be faxed to [phone]. Pls advise, thanks.

## 2019-11-22 NOTE — Telephone Encounter (Signed)
OK to send records per protocol (do we need a signed release, or we can print/mail to patient herself)

## 2019-11-22 NOTE — Telephone Encounter (Signed)
New practice will need to fax a signed release form to [place] at [phone]

## 2019-11-22 NOTE — Telephone Encounter (Signed)
Spoke to pt's husband Hailey Parrish. Detailed msg given to partner. Direct fax number provided. Pt will contact new provider office  to complete a sign release before X-rays are sent. Direct call back info provided.

## 2019-12-01 DIAGNOSIS — M5416 Radiculopathy, lumbar region: Secondary | ICD-10-CM | POA: Diagnosis not present

## 2019-12-01 DIAGNOSIS — M545 Low back pain: Secondary | ICD-10-CM | POA: Diagnosis not present

## 2019-12-09 DIAGNOSIS — M5416 Radiculopathy, lumbar region: Secondary | ICD-10-CM | POA: Diagnosis not present

## 2019-12-14 DIAGNOSIS — M79605 Pain in left leg: Secondary | ICD-10-CM | POA: Diagnosis not present

## 2019-12-14 DIAGNOSIS — M5416 Radiculopathy, lumbar region: Secondary | ICD-10-CM | POA: Diagnosis not present

## 2019-12-14 DIAGNOSIS — Z6828 Body mass index (BMI) 28.0-28.9, adult: Secondary | ICD-10-CM | POA: Diagnosis not present

## 2019-12-14 DIAGNOSIS — A6000 Herpesviral infection of urogenital system, unspecified: Secondary | ICD-10-CM | POA: Diagnosis not present

## 2019-12-14 DIAGNOSIS — Z0000 Encounter for general adult medical examination without abnormal findings: Secondary | ICD-10-CM | POA: Diagnosis not present

## 2020-01-20 DIAGNOSIS — M4726 Other spondylosis with radiculopathy, lumbar region: Secondary | ICD-10-CM | POA: Diagnosis not present

## 2020-01-26 DIAGNOSIS — M4726 Other spondylosis with radiculopathy, lumbar region: Secondary | ICD-10-CM | POA: Diagnosis not present

## 2020-02-03 DIAGNOSIS — M545 Low back pain: Secondary | ICD-10-CM | POA: Diagnosis not present

## 2020-03-24 DIAGNOSIS — M5136 Other intervertebral disc degeneration, lumbar region: Secondary | ICD-10-CM | POA: Diagnosis not present

## 2020-04-06 DIAGNOSIS — M4722 Other spondylosis with radiculopathy, cervical region: Secondary | ICD-10-CM | POA: Diagnosis not present

## 2020-04-06 DIAGNOSIS — M542 Cervicalgia: Secondary | ICD-10-CM | POA: Diagnosis not present

## 2020-04-12 DIAGNOSIS — Z6827 Body mass index (BMI) 27.0-27.9, adult: Secondary | ICD-10-CM | POA: Diagnosis not present

## 2020-04-12 DIAGNOSIS — R42 Dizziness and giddiness: Secondary | ICD-10-CM | POA: Diagnosis not present

## 2020-04-12 DIAGNOSIS — E559 Vitamin D deficiency, unspecified: Secondary | ICD-10-CM | POA: Diagnosis not present

## 2020-04-12 DIAGNOSIS — E039 Hypothyroidism, unspecified: Secondary | ICD-10-CM | POA: Diagnosis not present

## 2020-04-12 DIAGNOSIS — M79605 Pain in left leg: Secondary | ICD-10-CM | POA: Diagnosis not present

## 2020-04-12 DIAGNOSIS — E782 Mixed hyperlipidemia: Secondary | ICD-10-CM | POA: Diagnosis not present

## 2020-04-12 DIAGNOSIS — N182 Chronic kidney disease, stage 2 (mild): Secondary | ICD-10-CM | POA: Diagnosis not present

## 2020-04-12 DIAGNOSIS — M2550 Pain in unspecified joint: Secondary | ICD-10-CM | POA: Diagnosis not present

## 2020-04-18 DIAGNOSIS — M4722 Other spondylosis with radiculopathy, cervical region: Secondary | ICD-10-CM | POA: Diagnosis not present

## 2020-05-04 DIAGNOSIS — M353 Polymyalgia rheumatica: Secondary | ICD-10-CM | POA: Diagnosis not present

## 2020-05-04 DIAGNOSIS — R700 Elevated erythrocyte sedimentation rate: Secondary | ICD-10-CM | POA: Diagnosis not present

## 2020-05-04 DIAGNOSIS — M2550 Pain in unspecified joint: Secondary | ICD-10-CM | POA: Diagnosis not present

## 2020-05-04 DIAGNOSIS — Z6826 Body mass index (BMI) 26.0-26.9, adult: Secondary | ICD-10-CM | POA: Diagnosis not present

## 2020-05-12 DIAGNOSIS — M75101 Unspecified rotator cuff tear or rupture of right shoulder, not specified as traumatic: Secondary | ICD-10-CM | POA: Diagnosis not present

## 2020-05-12 DIAGNOSIS — M48061 Spinal stenosis, lumbar region without neurogenic claudication: Secondary | ICD-10-CM | POA: Diagnosis not present

## 2020-05-12 DIAGNOSIS — M25519 Pain in unspecified shoulder: Secondary | ICD-10-CM | POA: Diagnosis not present

## 2020-05-12 DIAGNOSIS — M5030 Other cervical disc degeneration, unspecified cervical region: Secondary | ICD-10-CM | POA: Diagnosis not present

## 2020-05-18 DIAGNOSIS — M353 Polymyalgia rheumatica: Secondary | ICD-10-CM | POA: Diagnosis not present

## 2020-05-18 DIAGNOSIS — Z6826 Body mass index (BMI) 26.0-26.9, adult: Secondary | ICD-10-CM | POA: Diagnosis not present

## 2020-05-18 DIAGNOSIS — M2550 Pain in unspecified joint: Secondary | ICD-10-CM | POA: Diagnosis not present

## 2020-05-18 DIAGNOSIS — M25511 Pain in right shoulder: Secondary | ICD-10-CM | POA: Diagnosis not present

## 2020-05-25 DIAGNOSIS — M75101 Unspecified rotator cuff tear or rupture of right shoulder, not specified as traumatic: Secondary | ICD-10-CM | POA: Diagnosis not present

## 2020-06-05 DIAGNOSIS — M75111 Incomplete rotator cuff tear or rupture of right shoulder, not specified as traumatic: Secondary | ICD-10-CM | POA: Diagnosis not present

## 2020-06-05 DIAGNOSIS — M25511 Pain in right shoulder: Secondary | ICD-10-CM | POA: Diagnosis not present

## 2020-06-05 DIAGNOSIS — M7541 Impingement syndrome of right shoulder: Secondary | ICD-10-CM | POA: Diagnosis not present

## 2020-06-05 DIAGNOSIS — M7521 Bicipital tendinitis, right shoulder: Secondary | ICD-10-CM | POA: Diagnosis not present

## 2020-06-06 DIAGNOSIS — Z6826 Body mass index (BMI) 26.0-26.9, adult: Secondary | ICD-10-CM | POA: Diagnosis not present

## 2020-06-06 DIAGNOSIS — M353 Polymyalgia rheumatica: Secondary | ICD-10-CM | POA: Diagnosis not present

## 2020-06-13 ENCOUNTER — Encounter: Payer: Self-pay | Admitting: Osteopathic Medicine

## 2020-06-16 DIAGNOSIS — Z20822 Contact with and (suspected) exposure to covid-19: Secondary | ICD-10-CM | POA: Diagnosis not present

## 2020-07-11 DIAGNOSIS — N182 Chronic kidney disease, stage 2 (mild): Secondary | ICD-10-CM | POA: Diagnosis not present

## 2020-07-11 DIAGNOSIS — E039 Hypothyroidism, unspecified: Secondary | ICD-10-CM | POA: Diagnosis not present

## 2020-07-11 DIAGNOSIS — M2550 Pain in unspecified joint: Secondary | ICD-10-CM | POA: Diagnosis not present

## 2020-07-11 DIAGNOSIS — E782 Mixed hyperlipidemia: Secondary | ICD-10-CM | POA: Diagnosis not present

## 2020-07-11 DIAGNOSIS — M353 Polymyalgia rheumatica: Secondary | ICD-10-CM | POA: Diagnosis not present

## 2020-07-11 DIAGNOSIS — Z20822 Contact with and (suspected) exposure to covid-19: Secondary | ICD-10-CM | POA: Diagnosis not present

## 2020-07-11 DIAGNOSIS — Z6827 Body mass index (BMI) 27.0-27.9, adult: Secondary | ICD-10-CM | POA: Diagnosis not present

## 2020-07-24 DIAGNOSIS — M1990 Unspecified osteoarthritis, unspecified site: Secondary | ICD-10-CM | POA: Diagnosis not present

## 2020-07-25 DIAGNOSIS — Z23 Encounter for immunization: Secondary | ICD-10-CM | POA: Diagnosis not present

## 2020-09-12 DIAGNOSIS — Z1231 Encounter for screening mammogram for malignant neoplasm of breast: Secondary | ICD-10-CM | POA: Diagnosis not present

## 2020-09-12 DIAGNOSIS — N182 Chronic kidney disease, stage 2 (mild): Secondary | ICD-10-CM | POA: Diagnosis not present

## 2020-09-12 DIAGNOSIS — R700 Elevated erythrocyte sedimentation rate: Secondary | ICD-10-CM | POA: Diagnosis not present

## 2020-09-12 DIAGNOSIS — M1990 Unspecified osteoarthritis, unspecified site: Secondary | ICD-10-CM | POA: Diagnosis not present

## 2020-09-12 DIAGNOSIS — M353 Polymyalgia rheumatica: Secondary | ICD-10-CM | POA: Diagnosis not present

## 2020-09-12 DIAGNOSIS — E782 Mixed hyperlipidemia: Secondary | ICD-10-CM | POA: Diagnosis not present

## 2020-09-12 DIAGNOSIS — E039 Hypothyroidism, unspecified: Secondary | ICD-10-CM | POA: Diagnosis not present

## 2020-09-12 DIAGNOSIS — M2550 Pain in unspecified joint: Secondary | ICD-10-CM | POA: Diagnosis not present

## 2020-09-12 DIAGNOSIS — E559 Vitamin D deficiency, unspecified: Secondary | ICD-10-CM | POA: Diagnosis not present

## 2020-09-12 DIAGNOSIS — Z6827 Body mass index (BMI) 27.0-27.9, adult: Secondary | ICD-10-CM | POA: Diagnosis not present

## 2020-09-25 DIAGNOSIS — Z6827 Body mass index (BMI) 27.0-27.9, adult: Secondary | ICD-10-CM | POA: Diagnosis not present

## 2020-09-25 DIAGNOSIS — M064 Inflammatory polyarthropathy: Secondary | ICD-10-CM | POA: Diagnosis not present

## 2020-09-25 DIAGNOSIS — M1990 Unspecified osteoarthritis, unspecified site: Secondary | ICD-10-CM | POA: Diagnosis not present

## 2020-09-26 DIAGNOSIS — Z1231 Encounter for screening mammogram for malignant neoplasm of breast: Secondary | ICD-10-CM | POA: Diagnosis not present

## 2020-11-27 DIAGNOSIS — M2550 Pain in unspecified joint: Secondary | ICD-10-CM | POA: Diagnosis not present

## 2020-11-27 DIAGNOSIS — Z6828 Body mass index (BMI) 28.0-28.9, adult: Secondary | ICD-10-CM | POA: Diagnosis not present

## 2020-11-27 DIAGNOSIS — M1990 Unspecified osteoarthritis, unspecified site: Secondary | ICD-10-CM | POA: Diagnosis not present

## 2020-12-07 DIAGNOSIS — M65842 Other synovitis and tenosynovitis, left hand: Secondary | ICD-10-CM | POA: Diagnosis not present

## 2020-12-07 DIAGNOSIS — M064 Inflammatory polyarthropathy: Secondary | ICD-10-CM | POA: Diagnosis not present

## 2020-12-07 DIAGNOSIS — M1990 Unspecified osteoarthritis, unspecified site: Secondary | ICD-10-CM | POA: Diagnosis not present

## 2020-12-13 DIAGNOSIS — G5603 Carpal tunnel syndrome, bilateral upper limbs: Secondary | ICD-10-CM | POA: Diagnosis not present

## 2020-12-13 DIAGNOSIS — Z6827 Body mass index (BMI) 27.0-27.9, adult: Secondary | ICD-10-CM | POA: Diagnosis not present

## 2020-12-19 DIAGNOSIS — Z20822 Contact with and (suspected) exposure to covid-19: Secondary | ICD-10-CM | POA: Diagnosis not present

## 2020-12-19 DIAGNOSIS — Z0000 Encounter for general adult medical examination without abnormal findings: Secondary | ICD-10-CM | POA: Diagnosis not present

## 2020-12-19 DIAGNOSIS — Z6828 Body mass index (BMI) 28.0-28.9, adult: Secondary | ICD-10-CM | POA: Diagnosis not present

## 2020-12-19 DIAGNOSIS — Z8249 Family history of ischemic heart disease and other diseases of the circulatory system: Secondary | ICD-10-CM | POA: Diagnosis not present

## 2020-12-19 DIAGNOSIS — M2550 Pain in unspecified joint: Secondary | ICD-10-CM | POA: Diagnosis not present

## 2020-12-19 DIAGNOSIS — E039 Hypothyroidism, unspecified: Secondary | ICD-10-CM | POA: Diagnosis not present

## 2020-12-19 DIAGNOSIS — E782 Mixed hyperlipidemia: Secondary | ICD-10-CM | POA: Diagnosis not present

## 2020-12-25 DIAGNOSIS — G5603 Carpal tunnel syndrome, bilateral upper limbs: Secondary | ICD-10-CM | POA: Diagnosis not present

## 2021-01-01 DIAGNOSIS — G5603 Carpal tunnel syndrome, bilateral upper limbs: Secondary | ICD-10-CM | POA: Diagnosis not present

## 2021-01-05 DIAGNOSIS — Z23 Encounter for immunization: Secondary | ICD-10-CM | POA: Diagnosis not present

## 2021-01-08 DIAGNOSIS — M25532 Pain in left wrist: Secondary | ICD-10-CM | POA: Diagnosis not present

## 2021-01-08 DIAGNOSIS — G5603 Carpal tunnel syndrome, bilateral upper limbs: Secondary | ICD-10-CM | POA: Diagnosis not present

## 2021-01-08 DIAGNOSIS — M25531 Pain in right wrist: Secondary | ICD-10-CM | POA: Diagnosis not present

## 2021-01-15 DIAGNOSIS — G5601 Carpal tunnel syndrome, right upper limb: Secondary | ICD-10-CM | POA: Diagnosis not present

## 2021-01-29 DIAGNOSIS — M7552 Bursitis of left shoulder: Secondary | ICD-10-CM | POA: Diagnosis not present

## 2021-01-29 DIAGNOSIS — G5603 Carpal tunnel syndrome, bilateral upper limbs: Secondary | ICD-10-CM | POA: Diagnosis not present

## 2021-02-12 DIAGNOSIS — G5602 Carpal tunnel syndrome, left upper limb: Secondary | ICD-10-CM | POA: Diagnosis not present

## 2021-02-26 DIAGNOSIS — G5603 Carpal tunnel syndrome, bilateral upper limbs: Secondary | ICD-10-CM | POA: Diagnosis not present

## 2021-02-26 DIAGNOSIS — M25512 Pain in left shoulder: Secondary | ICD-10-CM | POA: Diagnosis not present

## 2021-03-06 DIAGNOSIS — M25512 Pain in left shoulder: Secondary | ICD-10-CM | POA: Diagnosis not present

## 2021-04-04 DIAGNOSIS — M25512 Pain in left shoulder: Secondary | ICD-10-CM | POA: Diagnosis not present

## 2021-04-04 DIAGNOSIS — M25511 Pain in right shoulder: Secondary | ICD-10-CM | POA: Diagnosis not present

## 2021-04-19 DIAGNOSIS — M25512 Pain in left shoulder: Secondary | ICD-10-CM | POA: Diagnosis not present

## 2021-04-19 DIAGNOSIS — M25511 Pain in right shoulder: Secondary | ICD-10-CM | POA: Diagnosis not present

## 2021-04-19 DIAGNOSIS — M6281 Muscle weakness (generalized): Secondary | ICD-10-CM | POA: Diagnosis not present

## 2021-04-19 DIAGNOSIS — M25611 Stiffness of right shoulder, not elsewhere classified: Secondary | ICD-10-CM | POA: Diagnosis not present

## 2021-04-19 DIAGNOSIS — M25612 Stiffness of left shoulder, not elsewhere classified: Secondary | ICD-10-CM | POA: Diagnosis not present

## 2021-04-27 DIAGNOSIS — M25511 Pain in right shoulder: Secondary | ICD-10-CM | POA: Diagnosis not present

## 2021-04-27 DIAGNOSIS — M25512 Pain in left shoulder: Secondary | ICD-10-CM | POA: Diagnosis not present

## 2021-04-27 DIAGNOSIS — M6281 Muscle weakness (generalized): Secondary | ICD-10-CM | POA: Diagnosis not present

## 2021-04-27 DIAGNOSIS — M25611 Stiffness of right shoulder, not elsewhere classified: Secondary | ICD-10-CM | POA: Diagnosis not present

## 2021-04-27 DIAGNOSIS — M25612 Stiffness of left shoulder, not elsewhere classified: Secondary | ICD-10-CM | POA: Diagnosis not present

## 2021-05-02 DIAGNOSIS — M25611 Stiffness of right shoulder, not elsewhere classified: Secondary | ICD-10-CM | POA: Diagnosis not present

## 2021-05-02 DIAGNOSIS — M25511 Pain in right shoulder: Secondary | ICD-10-CM | POA: Diagnosis not present

## 2021-05-02 DIAGNOSIS — M25512 Pain in left shoulder: Secondary | ICD-10-CM | POA: Diagnosis not present

## 2021-05-02 DIAGNOSIS — M25612 Stiffness of left shoulder, not elsewhere classified: Secondary | ICD-10-CM | POA: Diagnosis not present

## 2021-05-02 DIAGNOSIS — M6281 Muscle weakness (generalized): Secondary | ICD-10-CM | POA: Diagnosis not present

## 2021-05-04 DIAGNOSIS — M25511 Pain in right shoulder: Secondary | ICD-10-CM | POA: Diagnosis not present

## 2021-05-04 DIAGNOSIS — M25512 Pain in left shoulder: Secondary | ICD-10-CM | POA: Diagnosis not present

## 2021-05-04 DIAGNOSIS — M6281 Muscle weakness (generalized): Secondary | ICD-10-CM | POA: Diagnosis not present

## 2021-05-04 DIAGNOSIS — M25611 Stiffness of right shoulder, not elsewhere classified: Secondary | ICD-10-CM | POA: Diagnosis not present

## 2021-05-04 DIAGNOSIS — M25612 Stiffness of left shoulder, not elsewhere classified: Secondary | ICD-10-CM | POA: Diagnosis not present

## 2021-05-08 DIAGNOSIS — M25512 Pain in left shoulder: Secondary | ICD-10-CM | POA: Diagnosis not present

## 2021-05-08 DIAGNOSIS — M25511 Pain in right shoulder: Secondary | ICD-10-CM | POA: Diagnosis not present

## 2021-05-08 DIAGNOSIS — M6281 Muscle weakness (generalized): Secondary | ICD-10-CM | POA: Diagnosis not present

## 2021-05-08 DIAGNOSIS — M25611 Stiffness of right shoulder, not elsewhere classified: Secondary | ICD-10-CM | POA: Diagnosis not present

## 2021-05-08 DIAGNOSIS — M25612 Stiffness of left shoulder, not elsewhere classified: Secondary | ICD-10-CM | POA: Diagnosis not present

## 2021-05-10 DIAGNOSIS — M25512 Pain in left shoulder: Secondary | ICD-10-CM | POA: Diagnosis not present

## 2021-05-10 DIAGNOSIS — M25612 Stiffness of left shoulder, not elsewhere classified: Secondary | ICD-10-CM | POA: Diagnosis not present

## 2021-05-10 DIAGNOSIS — M6281 Muscle weakness (generalized): Secondary | ICD-10-CM | POA: Diagnosis not present

## 2021-05-10 DIAGNOSIS — M25611 Stiffness of right shoulder, not elsewhere classified: Secondary | ICD-10-CM | POA: Diagnosis not present

## 2021-05-10 DIAGNOSIS — M25511 Pain in right shoulder: Secondary | ICD-10-CM | POA: Diagnosis not present

## 2021-05-14 DIAGNOSIS — M6281 Muscle weakness (generalized): Secondary | ICD-10-CM | POA: Diagnosis not present

## 2021-05-14 DIAGNOSIS — M25612 Stiffness of left shoulder, not elsewhere classified: Secondary | ICD-10-CM | POA: Diagnosis not present

## 2021-05-14 DIAGNOSIS — M25611 Stiffness of right shoulder, not elsewhere classified: Secondary | ICD-10-CM | POA: Diagnosis not present

## 2021-05-14 DIAGNOSIS — M25512 Pain in left shoulder: Secondary | ICD-10-CM | POA: Diagnosis not present

## 2021-05-14 DIAGNOSIS — M25511 Pain in right shoulder: Secondary | ICD-10-CM | POA: Diagnosis not present

## 2021-05-16 DIAGNOSIS — M25511 Pain in right shoulder: Secondary | ICD-10-CM | POA: Diagnosis not present

## 2021-05-16 DIAGNOSIS — M25512 Pain in left shoulder: Secondary | ICD-10-CM | POA: Diagnosis not present

## 2021-05-16 DIAGNOSIS — M25611 Stiffness of right shoulder, not elsewhere classified: Secondary | ICD-10-CM | POA: Diagnosis not present

## 2021-05-16 DIAGNOSIS — M6281 Muscle weakness (generalized): Secondary | ICD-10-CM | POA: Diagnosis not present

## 2021-05-16 DIAGNOSIS — M25612 Stiffness of left shoulder, not elsewhere classified: Secondary | ICD-10-CM | POA: Diagnosis not present

## 2021-05-30 DIAGNOSIS — M25611 Stiffness of right shoulder, not elsewhere classified: Secondary | ICD-10-CM | POA: Diagnosis not present

## 2021-05-30 DIAGNOSIS — M25512 Pain in left shoulder: Secondary | ICD-10-CM | POA: Diagnosis not present

## 2021-05-30 DIAGNOSIS — M25511 Pain in right shoulder: Secondary | ICD-10-CM | POA: Diagnosis not present

## 2021-05-30 DIAGNOSIS — M6281 Muscle weakness (generalized): Secondary | ICD-10-CM | POA: Diagnosis not present

## 2021-05-30 DIAGNOSIS — M25612 Stiffness of left shoulder, not elsewhere classified: Secondary | ICD-10-CM | POA: Diagnosis not present

## 2021-06-01 DIAGNOSIS — M25511 Pain in right shoulder: Secondary | ICD-10-CM | POA: Diagnosis not present

## 2021-06-01 DIAGNOSIS — M7502 Adhesive capsulitis of left shoulder: Secondary | ICD-10-CM | POA: Diagnosis not present

## 2021-06-01 DIAGNOSIS — M25512 Pain in left shoulder: Secondary | ICD-10-CM | POA: Diagnosis not present

## 2021-06-01 DIAGNOSIS — M7501 Adhesive capsulitis of right shoulder: Secondary | ICD-10-CM | POA: Diagnosis not present

## 2021-07-02 DIAGNOSIS — U071 COVID-19: Secondary | ICD-10-CM | POA: Diagnosis not present

## 2021-07-10 DIAGNOSIS — Z23 Encounter for immunization: Secondary | ICD-10-CM | POA: Diagnosis not present

## 2021-07-30 DIAGNOSIS — B009 Herpesviral infection, unspecified: Secondary | ICD-10-CM | POA: Diagnosis not present

## 2021-07-30 DIAGNOSIS — M79604 Pain in right leg: Secondary | ICD-10-CM | POA: Diagnosis not present

## 2021-07-30 DIAGNOSIS — N182 Chronic kidney disease, stage 2 (mild): Secondary | ICD-10-CM | POA: Diagnosis not present

## 2021-07-30 DIAGNOSIS — M353 Polymyalgia rheumatica: Secondary | ICD-10-CM | POA: Diagnosis not present

## 2021-07-30 DIAGNOSIS — E782 Mixed hyperlipidemia: Secondary | ICD-10-CM | POA: Diagnosis not present

## 2021-07-30 DIAGNOSIS — M79605 Pain in left leg: Secondary | ICD-10-CM | POA: Diagnosis not present

## 2021-07-30 DIAGNOSIS — Z23 Encounter for immunization: Secondary | ICD-10-CM | POA: Diagnosis not present

## 2021-07-30 DIAGNOSIS — A6000 Herpesviral infection of urogenital system, unspecified: Secondary | ICD-10-CM | POA: Diagnosis not present

## 2021-07-30 DIAGNOSIS — E039 Hypothyroidism, unspecified: Secondary | ICD-10-CM | POA: Diagnosis not present

## 2021-08-11 IMAGING — MG DIGITAL SCREENING BILAT W/ TOMO W/ CAD
6 of 10 series · 6 of 30 positions shown · non-contrast
Comparison: Previous exam(s).

ACR Breast Density Category a: The breast tissue is almost entirely
fatty.

CLINICAL DATA: Screening.

EXAM:
DIGITAL SCREENING BILATERAL MAMMOGRAM WITH TOMO AND CAD

[R MLO synth-2D]
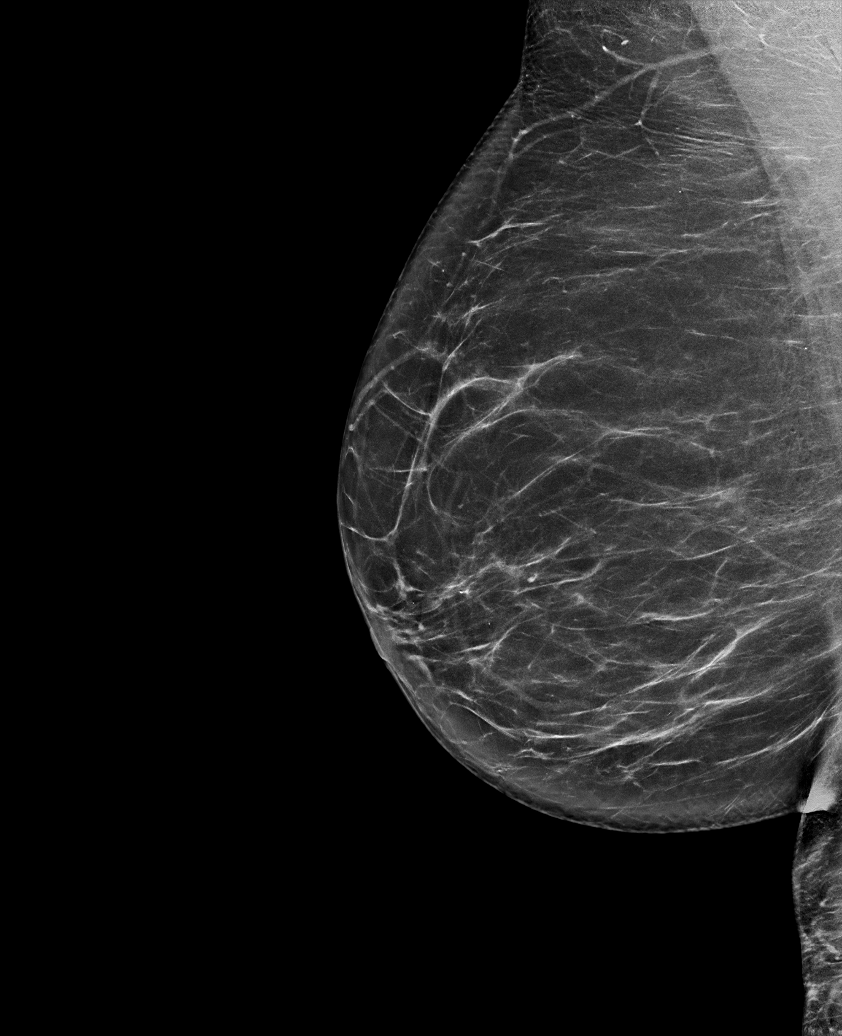

[L CC synth-2D]
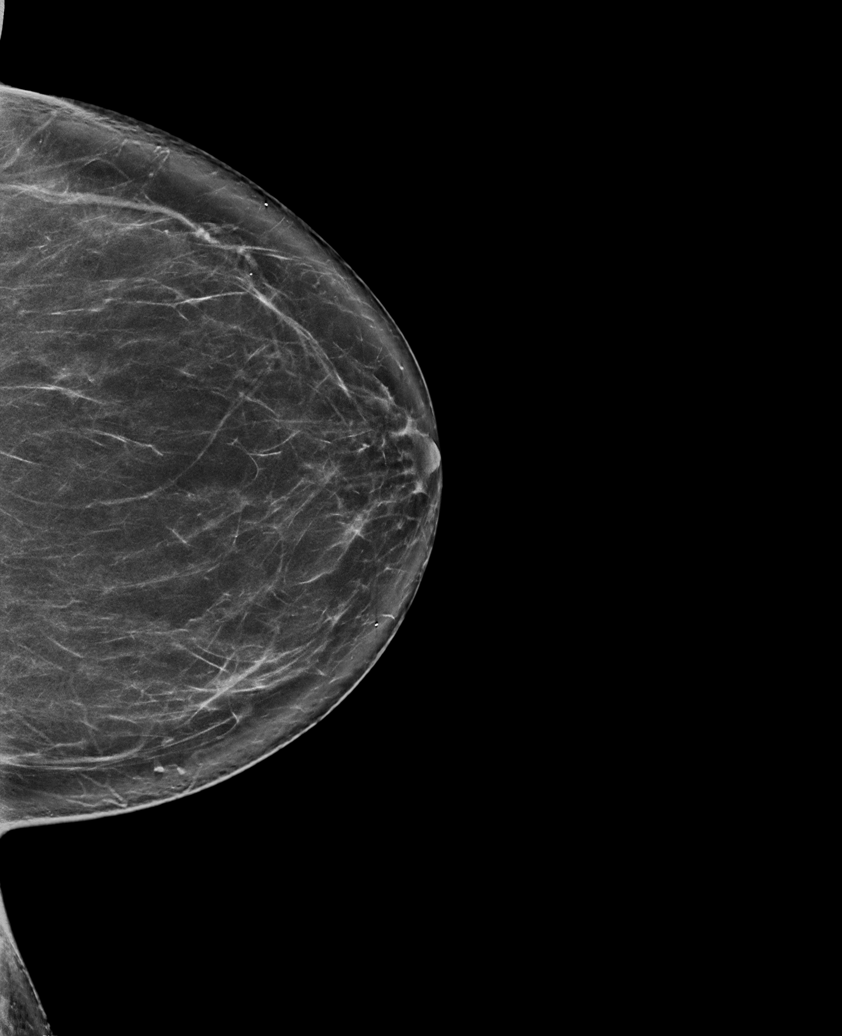

[R CC synth-2D]
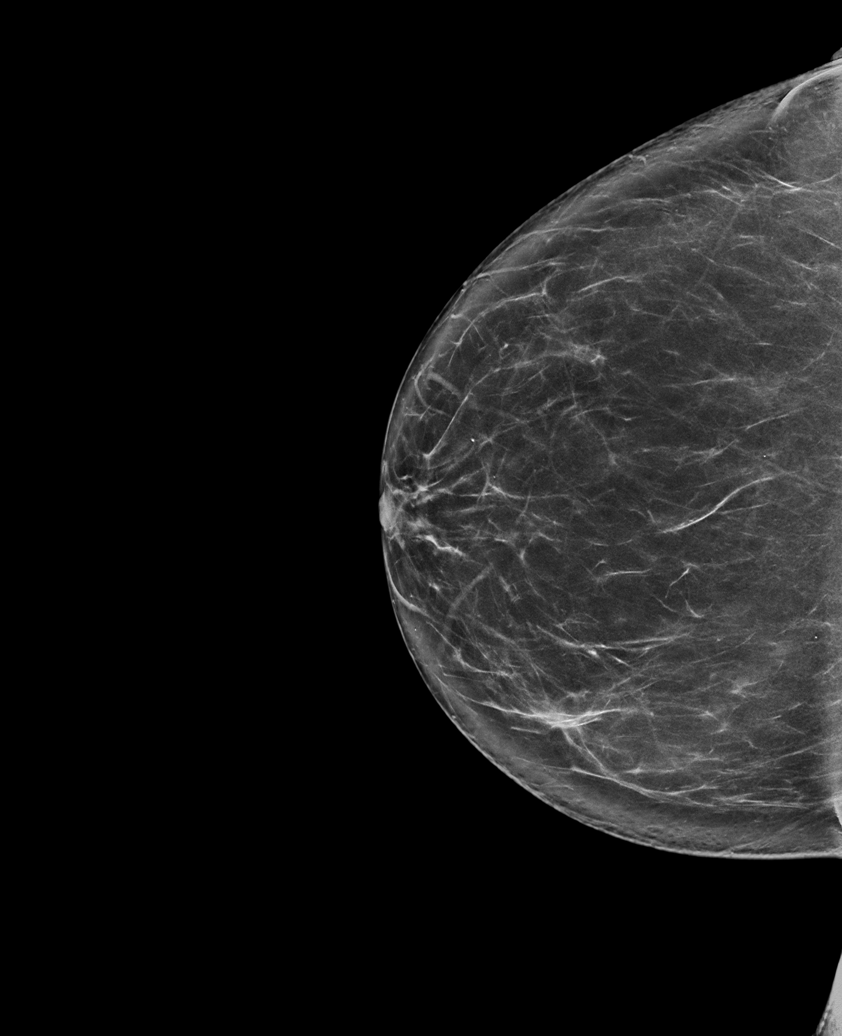

[L MLO synth-2D (1 of 2)]
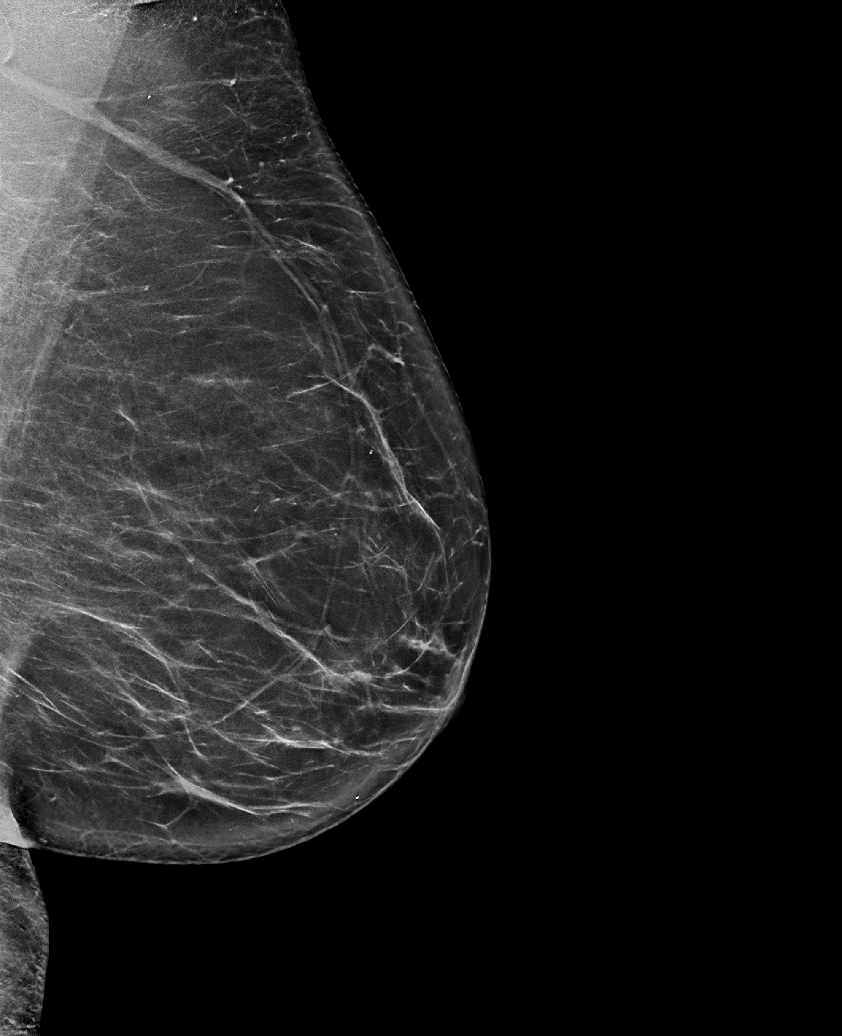

[L MLO synth-2D (2 of 2)]
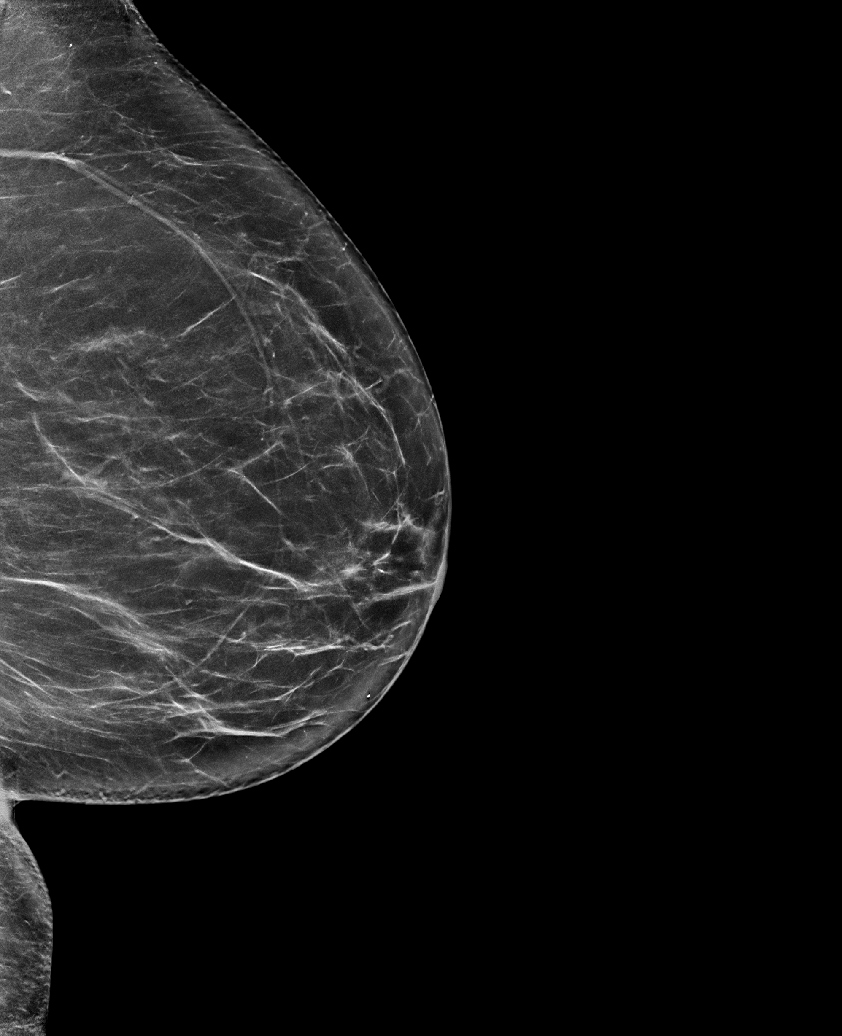

[L MLO tomo · tomo slice 40/79.0]
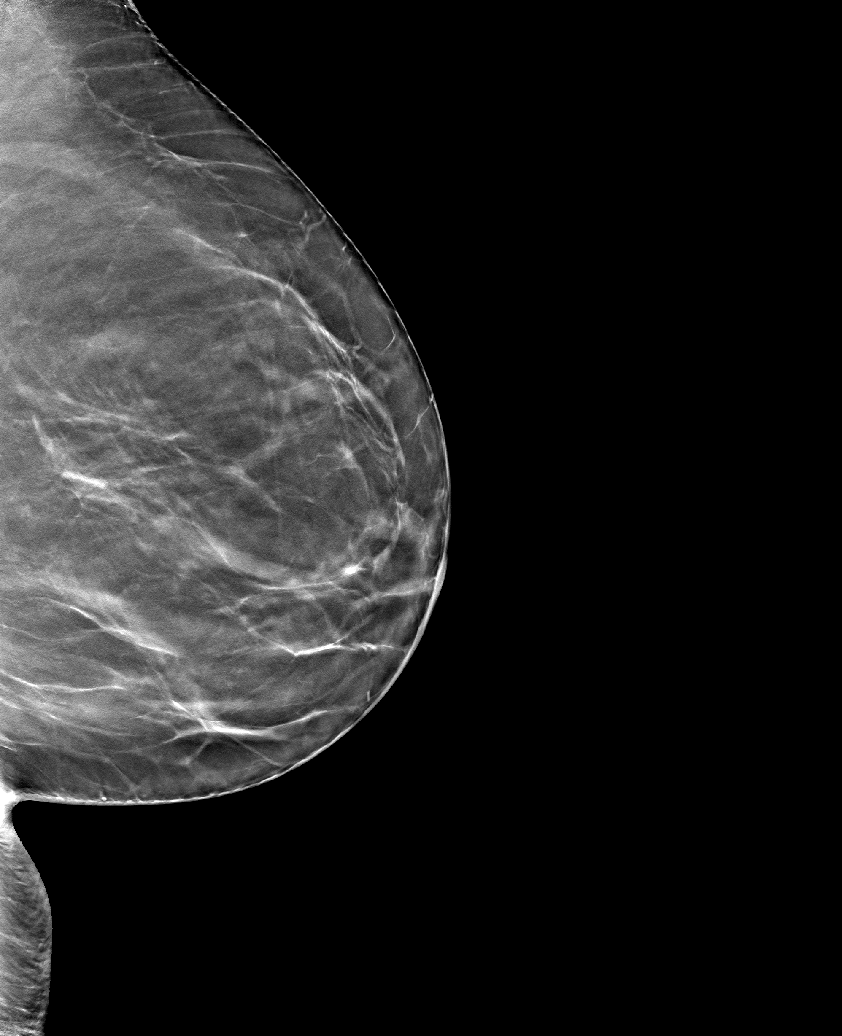

[6 of 30 positions shown; findings below may reference images not displayed]

FINDINGS: There are no findings suspicious for malignancy. Images were
processed with CAD.
IMPRESSION: No mammographic evidence of malignancy. A result letter of this
screening mammogram will be mailed directly to the patient.

RECOMMENDATION:
Screening mammogram in one year. (Code:[id number]-[id number])

BI-RADS CATEGORY  1: Negative.

## 2021-09-07 DIAGNOSIS — M25512 Pain in left shoulder: Secondary | ICD-10-CM | POA: Diagnosis not present

## 2021-09-07 DIAGNOSIS — M7501 Adhesive capsulitis of right shoulder: Secondary | ICD-10-CM | POA: Diagnosis not present

## 2021-09-07 DIAGNOSIS — M25511 Pain in right shoulder: Secondary | ICD-10-CM | POA: Diagnosis not present

## 2021-09-07 DIAGNOSIS — M7502 Adhesive capsulitis of left shoulder: Secondary | ICD-10-CM | POA: Diagnosis not present

## 2021-09-18 DIAGNOSIS — M19012 Primary osteoarthritis, left shoulder: Secondary | ICD-10-CM | POA: Diagnosis not present

## 2021-09-19 DIAGNOSIS — M5136 Other intervertebral disc degeneration, lumbar region: Secondary | ICD-10-CM | POA: Diagnosis not present

## 2021-09-19 DIAGNOSIS — M353 Polymyalgia rheumatica: Secondary | ICD-10-CM | POA: Diagnosis not present

## 2021-09-19 DIAGNOSIS — M7061 Trochanteric bursitis, right hip: Secondary | ICD-10-CM | POA: Diagnosis not present

## 2021-09-19 DIAGNOSIS — M545 Low back pain, unspecified: Secondary | ICD-10-CM | POA: Diagnosis not present

## 2021-09-19 DIAGNOSIS — M533 Sacrococcygeal disorders, not elsewhere classified: Secondary | ICD-10-CM | POA: Diagnosis not present

## 2021-10-18 DIAGNOSIS — M545 Low back pain, unspecified: Secondary | ICD-10-CM | POA: Diagnosis not present

## 2021-10-18 DIAGNOSIS — M6281 Muscle weakness (generalized): Secondary | ICD-10-CM | POA: Diagnosis not present

## 2021-10-25 DIAGNOSIS — M545 Low back pain, unspecified: Secondary | ICD-10-CM | POA: Diagnosis not present

## 2021-10-25 DIAGNOSIS — M6281 Muscle weakness (generalized): Secondary | ICD-10-CM | POA: Diagnosis not present

## 2021-10-31 DIAGNOSIS — M7061 Trochanteric bursitis, right hip: Secondary | ICD-10-CM | POA: Diagnosis not present

## 2021-10-31 DIAGNOSIS — M5136 Other intervertebral disc degeneration, lumbar region: Secondary | ICD-10-CM | POA: Diagnosis not present

## 2021-10-31 DIAGNOSIS — M353 Polymyalgia rheumatica: Secondary | ICD-10-CM | POA: Diagnosis not present

## 2021-10-31 DIAGNOSIS — M533 Sacrococcygeal disorders, not elsewhere classified: Secondary | ICD-10-CM | POA: Diagnosis not present

## 2021-11-08 DIAGNOSIS — M545 Low back pain, unspecified: Secondary | ICD-10-CM | POA: Diagnosis not present

## 2021-11-08 DIAGNOSIS — M6281 Muscle weakness (generalized): Secondary | ICD-10-CM | POA: Diagnosis not present

## 2021-11-13 DIAGNOSIS — M6281 Muscle weakness (generalized): Secondary | ICD-10-CM | POA: Diagnosis not present

## 2021-11-13 DIAGNOSIS — M545 Low back pain, unspecified: Secondary | ICD-10-CM | POA: Diagnosis not present

## 2021-11-28 DIAGNOSIS — M5136 Other intervertebral disc degeneration, lumbar region: Secondary | ICD-10-CM | POA: Diagnosis not present

## 2021-11-28 DIAGNOSIS — M533 Sacrococcygeal disorders, not elsewhere classified: Secondary | ICD-10-CM | POA: Diagnosis not present

## 2021-11-28 DIAGNOSIS — M353 Polymyalgia rheumatica: Secondary | ICD-10-CM | POA: Diagnosis not present

## 2021-11-28 DIAGNOSIS — M5126 Other intervertebral disc displacement, lumbar region: Secondary | ICD-10-CM | POA: Diagnosis not present

## 2021-11-28 DIAGNOSIS — M7061 Trochanteric bursitis, right hip: Secondary | ICD-10-CM | POA: Diagnosis not present

## 2021-12-05 DIAGNOSIS — M5126 Other intervertebral disc displacement, lumbar region: Secondary | ICD-10-CM | POA: Diagnosis not present

## 2021-12-10 DIAGNOSIS — M7502 Adhesive capsulitis of left shoulder: Secondary | ICD-10-CM | POA: Diagnosis not present

## 2021-12-10 DIAGNOSIS — M7501 Adhesive capsulitis of right shoulder: Secondary | ICD-10-CM | POA: Diagnosis not present

## 2021-12-12 DIAGNOSIS — M533 Sacrococcygeal disorders, not elsewhere classified: Secondary | ICD-10-CM | POA: Diagnosis not present

## 2021-12-12 DIAGNOSIS — M5126 Other intervertebral disc displacement, lumbar region: Secondary | ICD-10-CM | POA: Diagnosis not present

## 2021-12-12 DIAGNOSIS — M7061 Trochanteric bursitis, right hip: Secondary | ICD-10-CM | POA: Diagnosis not present

## 2021-12-20 DIAGNOSIS — Z1231 Encounter for screening mammogram for malignant neoplasm of breast: Secondary | ICD-10-CM | POA: Diagnosis not present

## 2022-01-09 DIAGNOSIS — M6281 Muscle weakness (generalized): Secondary | ICD-10-CM | POA: Diagnosis not present

## 2022-01-09 DIAGNOSIS — M545 Low back pain, unspecified: Secondary | ICD-10-CM | POA: Diagnosis not present

## 2022-01-09 DIAGNOSIS — R262 Difficulty in walking, not elsewhere classified: Secondary | ICD-10-CM | POA: Diagnosis not present

## 2022-01-11 DIAGNOSIS — M545 Low back pain, unspecified: Secondary | ICD-10-CM | POA: Diagnosis not present

## 2022-01-11 DIAGNOSIS — R262 Difficulty in walking, not elsewhere classified: Secondary | ICD-10-CM | POA: Diagnosis not present

## 2022-01-11 DIAGNOSIS — M6281 Muscle weakness (generalized): Secondary | ICD-10-CM | POA: Diagnosis not present

## 2022-01-14 DIAGNOSIS — R262 Difficulty in walking, not elsewhere classified: Secondary | ICD-10-CM | POA: Diagnosis not present

## 2022-01-14 DIAGNOSIS — M6281 Muscle weakness (generalized): Secondary | ICD-10-CM | POA: Diagnosis not present

## 2022-01-14 DIAGNOSIS — M545 Low back pain, unspecified: Secondary | ICD-10-CM | POA: Diagnosis not present

## 2022-01-23 DIAGNOSIS — M6281 Muscle weakness (generalized): Secondary | ICD-10-CM | POA: Diagnosis not present

## 2022-01-23 DIAGNOSIS — R262 Difficulty in walking, not elsewhere classified: Secondary | ICD-10-CM | POA: Diagnosis not present

## 2022-01-23 DIAGNOSIS — M545 Low back pain, unspecified: Secondary | ICD-10-CM | POA: Diagnosis not present

## 2022-01-30 DIAGNOSIS — A6000 Herpesviral infection of urogenital system, unspecified: Secondary | ICD-10-CM | POA: Diagnosis not present

## 2022-01-30 DIAGNOSIS — Z1211 Encounter for screening for malignant neoplasm of colon: Secondary | ICD-10-CM | POA: Diagnosis not present

## 2022-01-30 DIAGNOSIS — E039 Hypothyroidism, unspecified: Secondary | ICD-10-CM | POA: Diagnosis not present

## 2022-01-30 DIAGNOSIS — N182 Chronic kidney disease, stage 2 (mild): Secondary | ICD-10-CM | POA: Diagnosis not present

## 2022-01-30 DIAGNOSIS — Z0000 Encounter for general adult medical examination without abnormal findings: Secondary | ICD-10-CM | POA: Diagnosis not present

## 2022-01-30 DIAGNOSIS — E782 Mixed hyperlipidemia: Secondary | ICD-10-CM | POA: Diagnosis not present

## 2022-02-19 DIAGNOSIS — Z1211 Encounter for screening for malignant neoplasm of colon: Secondary | ICD-10-CM | POA: Diagnosis not present

## 2022-04-03 DIAGNOSIS — M170 Bilateral primary osteoarthritis of knee: Secondary | ICD-10-CM | POA: Diagnosis not present

## 2022-04-03 DIAGNOSIS — M25561 Pain in right knee: Secondary | ICD-10-CM | POA: Diagnosis not present

## 2022-04-03 DIAGNOSIS — M7061 Trochanteric bursitis, right hip: Secondary | ICD-10-CM | POA: Diagnosis not present

## 2022-04-03 DIAGNOSIS — M5126 Other intervertebral disc displacement, lumbar region: Secondary | ICD-10-CM | POA: Diagnosis not present

## 2022-04-03 DIAGNOSIS — M533 Sacrococcygeal disorders, not elsewhere classified: Secondary | ICD-10-CM | POA: Diagnosis not present

## 2022-05-02 DIAGNOSIS — Z79890 Hormone replacement therapy (postmenopausal): Secondary | ICD-10-CM | POA: Diagnosis not present

## 2022-05-02 DIAGNOSIS — N951 Menopausal and female climacteric states: Secondary | ICD-10-CM | POA: Diagnosis not present

## 2022-05-20 DIAGNOSIS — N951 Menopausal and female climacteric states: Secondary | ICD-10-CM | POA: Diagnosis not present

## 2022-07-10 DIAGNOSIS — E039 Hypothyroidism, unspecified: Secondary | ICD-10-CM | POA: Diagnosis not present

## 2022-07-10 DIAGNOSIS — N182 Chronic kidney disease, stage 2 (mild): Secondary | ICD-10-CM | POA: Diagnosis not present

## 2022-07-10 DIAGNOSIS — Z23 Encounter for immunization: Secondary | ICD-10-CM | POA: Diagnosis not present

## 2022-07-10 DIAGNOSIS — E782 Mixed hyperlipidemia: Secondary | ICD-10-CM | POA: Diagnosis not present

## 2022-07-10 DIAGNOSIS — E559 Vitamin D deficiency, unspecified: Secondary | ICD-10-CM | POA: Diagnosis not present

## 2022-08-05 DIAGNOSIS — M7062 Trochanteric bursitis, left hip: Secondary | ICD-10-CM | POA: Diagnosis not present

## 2022-08-05 DIAGNOSIS — M7061 Trochanteric bursitis, right hip: Secondary | ICD-10-CM | POA: Diagnosis not present

## 2022-08-05 DIAGNOSIS — M5136 Other intervertebral disc degeneration, lumbar region: Secondary | ICD-10-CM | POA: Diagnosis not present

## 2022-08-05 DIAGNOSIS — M170 Bilateral primary osteoarthritis of knee: Secondary | ICD-10-CM | POA: Diagnosis not present

## 2022-08-21 DIAGNOSIS — R262 Difficulty in walking, not elsewhere classified: Secondary | ICD-10-CM | POA: Diagnosis not present

## 2022-08-21 DIAGNOSIS — M25561 Pain in right knee: Secondary | ICD-10-CM | POA: Diagnosis not present

## 2022-08-21 DIAGNOSIS — M6281 Muscle weakness (generalized): Secondary | ICD-10-CM | POA: Diagnosis not present

## 2022-08-21 DIAGNOSIS — M25562 Pain in left knee: Secondary | ICD-10-CM | POA: Diagnosis not present

## 2022-08-26 DIAGNOSIS — R262 Difficulty in walking, not elsewhere classified: Secondary | ICD-10-CM | POA: Diagnosis not present

## 2022-08-26 DIAGNOSIS — M6281 Muscle weakness (generalized): Secondary | ICD-10-CM | POA: Diagnosis not present

## 2022-08-26 DIAGNOSIS — M25561 Pain in right knee: Secondary | ICD-10-CM | POA: Diagnosis not present

## 2022-08-26 DIAGNOSIS — M25562 Pain in left knee: Secondary | ICD-10-CM | POA: Diagnosis not present

## 2022-09-04 DIAGNOSIS — M25561 Pain in right knee: Secondary | ICD-10-CM | POA: Diagnosis not present

## 2022-09-04 DIAGNOSIS — R262 Difficulty in walking, not elsewhere classified: Secondary | ICD-10-CM | POA: Diagnosis not present

## 2022-09-04 DIAGNOSIS — M25562 Pain in left knee: Secondary | ICD-10-CM | POA: Diagnosis not present

## 2022-09-04 DIAGNOSIS — M6281 Muscle weakness (generalized): Secondary | ICD-10-CM | POA: Diagnosis not present
